# Patient Record
Sex: Female | Born: 1980 | Race: Black or African American | Hispanic: No | Marital: Single | Smoking: Current every day smoker
Health system: Southern US, Community
[De-identification: ages and names within clinical notes are randomized; demographics above are authoritative.]

## PROBLEM LIST (undated history)

## (undated) HISTORY — PX: NO PAST SURGERIES: SHX2092

---

## 2013-05-14 ENCOUNTER — Encounter (HOSPITAL_BASED_OUTPATIENT_CLINIC_OR_DEPARTMENT_OTHER): Payer: Self-pay | Admitting: *Deleted

## 2013-05-14 ENCOUNTER — Emergency Department (HOSPITAL_BASED_OUTPATIENT_CLINIC_OR_DEPARTMENT_OTHER)
Admission: EM | Admit: 2013-05-14 | Discharge: 2013-05-14 | Disposition: A | Discharge status: Home / Self Care | Payer: No Typology Code available for payment source | Attending: Emergency Medicine | Admitting: Emergency Medicine

## 2013-05-14 DIAGNOSIS — R220 Localized swelling, mass and lump, head: Secondary | ICD-10-CM | POA: Diagnosis present

## 2013-05-14 DIAGNOSIS — K089 Disorder of teeth and supporting structures, unspecified: Secondary | ICD-10-CM | POA: Insufficient documentation

## 2013-05-14 DIAGNOSIS — F17200 Nicotine dependence, unspecified, uncomplicated: Secondary | ICD-10-CM | POA: Insufficient documentation

## 2013-05-14 DIAGNOSIS — K0889 Other specified disorders of teeth and supporting structures: Secondary | ICD-10-CM | POA: Diagnosis present

## 2013-05-14 MED ORDER — OXYCODONE-ACETAMINOPHEN 5-325 MG PO TABS: 1.0000 | ORAL_TABLET | Freq: Four times a day (QID) | ORAL | Status: DC | PRN

## 2013-05-14 NOTE — ED Provider Notes (Signed)
CSN: [id number]     Arrival date & time 05/14/13  0816 History   First MD Initiated Contact with Patient 05/14/13 [phone]     Chief Complaint  Patient presents with  . facial swelling    (Consider location/radiation/quality/duration/timing/severity/associated sxs/prior Treatment) Patient is a 32 y.o. female presenting with tooth pain. The history is provided by the patient.  Dental Pain Location:  Lower Lower teeth location:  19/LL 1st molar Quality:  Dull Severity:  Moderate Onset quality:  Gradual Duration:  2 days Timing:  Constant Progression:  Worsening Chronicity:  Recurrent Context: dental caries   Previous work-up:  Dental exam and filled cavity Relieved by:  Nothing Worsened by:  Nothing tried Ineffective treatments:  NSAIDs Associated symptoms: no congestion, no drooling, no fever, no headaches, no neck pain, no neck swelling and no trismus     History reviewed. No pertinent past medical history. History reviewed. No pertinent past surgical history. History reviewed. No pertinent family history. History  Substance Use Topics  . Smoking status: Current Every Day Smoker  . Smokeless tobacco: Not on file  . Alcohol Use: No   OB History   Grav Para Term Preterm Abortions TAB SAB Ect Mult Living                 Review of Systems  Constitutional: Negative for fever and fatigue.  HENT: Negative for congestion, drooling and neck pain.   Eyes: Negative for pain.  Respiratory: Negative for cough and shortness of breath.   Cardiovascular: Negative for chest pain.  Gastrointestinal: Negative for nausea, vomiting, abdominal pain and diarrhea.  Genitourinary: Negative for dysuria and hematuria.  Musculoskeletal: Negative for back pain and gait problem.  Skin: Negative for color change.  Neurological: Negative for dizziness and headaches.  Hematological: Negative for adenopathy.  Psychiatric/Behavioral: Negative for behavioral problems.  All other systems reviewed and  are negative.    Allergies  Review of patient's allergies indicates no known allergies.  Home Medications   Current Outpatient Rx  Name  Route  Sig  Dispense  Refill  . oxyCODONE-acetaminophen (PERCOCET) 5-325 MG per tablet   Oral   Take 1 tablet by mouth every 6 (six) hours as needed for pain.   15 tablet   0    BP 135/79  Pulse 92  Temp(Src) 98.1 F (36.7 C) (Oral)  Resp 16  Ht 5\' 11"  (1.803 m)  Wt 280 lb (127.007 kg)  BMI 39.07 kg/m2  SpO2 99%  LMP 05/09/2013 Physical Exam  Nursing note and vitals reviewed. Constitutional: She is oriented to person, place, and time. She appears well-developed and well-nourished.  HENT:  Head: Normocephalic.  Mouth/Throat: Oropharynx is clear and moist. No oropharyngeal exudate.  No trismus. Normal rom of neck.   Mild to moderate soft tissue swelling noted at mid left mandible.   Ttp of left lower 1st molar. Temporary filling in place. No peri-apical abscess noted.   Eyes: Conjunctivae and EOM are normal. Pupils are equal, round, and reactive to light.  Neck: Normal range of motion. Neck supple.  Cardiovascular: Normal rate, regular rhythm, normal heart sounds and intact distal pulses.  Exam reveals no gallop and no friction rub.   No murmur heard. Pulmonary/Chest: Effort normal and breath sounds normal. No respiratory distress. She has no wheezes.  Abdominal: Soft. Bowel sounds are normal. There is no tenderness. There is no rebound and no guarding.  Musculoskeletal: Normal range of motion. She exhibits no edema and no tenderness.  Neurological: She  is alert and oriented to person, place, and time.  Skin: Skin is warm and dry.  Psychiatric: She has a normal mood and affect. Her behavior is normal.    ED Course  Procedures (including critical care time) Labs Review Labs Reviewed - No data to display Imaging Review No results found.  MDM   1. Pain, dental   2. Facial swelling    9:08 AM 32 y.o. female who presents with  dental pain that began 2 days ago. The patient saw dentist at that time who started her on amoxicillin and recommended extraction, but the patient was unable to pay for this. The patient denies any fevers and notes continued pain and left-sided jaw swelling. She has had dental pain in this area before. The patient is afebrile and vital signs are unremarkable here. She has no submandibular swelling concerning for ludwigs angina, no trismus noted, normal range of motion of neck. Recommend that patient continue taking antibiotics and will provide Percocet for pain control. We'll also provide outpatient resources for dentistry and recommend close followup.  9:10 AM:  I have discussed the diagnosis/risks/treatment options with the patient and believe the pt to be eligible for discharge home to follow-up with a dentist asap. We also discussed returning to the ED immediately if new or worsening sx occur. We discussed the sx which are most concerning (e.g., worsening swelling, worsening pain, ) that necessitate immediate return. Any new prescriptions provided to the patient are listed below.  New Prescriptions   OXYCODONE-ACETAMINOPHEN (PERCOCET) 5-325 MG PER TABLET    Take 1 tablet by mouth every 6 (six) hours as needed for pain.       Junius Argyle, MD 05/14/13 [phone]

## 2013-05-14 NOTE — ED Notes (Signed)
Pt states believes she has developed an abcessed tooth left lower . Left side of face is swelling and painful. Thinks needs antibiotic

## 2015-12-01 ENCOUNTER — Encounter (HOSPITAL_BASED_OUTPATIENT_CLINIC_OR_DEPARTMENT_OTHER): Payer: Self-pay | Admitting: *Deleted

## 2015-12-01 ENCOUNTER — Emergency Department (HOSPITAL_BASED_OUTPATIENT_CLINIC_OR_DEPARTMENT_OTHER)
Admission: EM | Admit: 2015-12-01 | Discharge: 2015-12-01 | Disposition: A | Discharge status: Home / Self Care | Payer: Medicaid Other | Attending: Emergency Medicine | Admitting: Emergency Medicine

## 2015-12-01 ENCOUNTER — Emergency Department (HOSPITAL_BASED_OUTPATIENT_CLINIC_OR_DEPARTMENT_OTHER): Payer: Medicaid Other

## 2015-12-01 DIAGNOSIS — R109 Unspecified abdominal pain: Secondary | ICD-10-CM

## 2015-12-01 DIAGNOSIS — O99332 Smoking (tobacco) complicating pregnancy, second trimester: Secondary | ICD-10-CM | POA: Insufficient documentation

## 2015-12-01 DIAGNOSIS — F17210 Nicotine dependence, cigarettes, uncomplicated: Secondary | ICD-10-CM | POA: Insufficient documentation

## 2015-12-01 DIAGNOSIS — O9989 Other specified diseases and conditions complicating pregnancy, childbirth and the puerperium: Secondary | ICD-10-CM | POA: Diagnosis present

## 2015-12-01 DIAGNOSIS — N760 Acute vaginitis: Secondary | ICD-10-CM

## 2015-12-01 DIAGNOSIS — B9689 Other specified bacterial agents as the cause of diseases classified elsewhere: Secondary | ICD-10-CM

## 2015-12-01 DIAGNOSIS — Z3A16 16 weeks gestation of pregnancy: Secondary | ICD-10-CM | POA: Diagnosis not present

## 2015-12-01 DIAGNOSIS — O26899 Other specified pregnancy related conditions, unspecified trimester: Secondary | ICD-10-CM

## 2015-12-01 DIAGNOSIS — O0992 Supervision of high risk pregnancy, unspecified, second trimester: Secondary | ICD-10-CM | POA: Diagnosis not present

## 2015-12-01 DIAGNOSIS — Z3490 Encounter for supervision of normal pregnancy, unspecified, unspecified trimester: Secondary | ICD-10-CM

## 2015-12-01 DIAGNOSIS — O30002 Twin pregnancy, unspecified number of placenta and unspecified number of amniotic sacs, second trimester: Secondary | ICD-10-CM | POA: Insufficient documentation

## 2015-12-01 DIAGNOSIS — O23592 Infection of other part of genital tract in pregnancy, second trimester: Secondary | ICD-10-CM | POA: Insufficient documentation

## 2015-12-01 DIAGNOSIS — R1030 Lower abdominal pain, unspecified: Secondary | ICD-10-CM

## 2015-12-01 LAB — URINALYSIS, ROUTINE W REFLEX MICROSCOPIC
BILIRUBIN URINE: NEGATIVE
GLUCOSE, UA: NEGATIVE mg/dL
HGB URINE DIPSTICK: NEGATIVE
KETONES UR: NEGATIVE mg/dL
Nitrite: NEGATIVE
PH: 6.5 (ref 5.0–8.0)
Protein, ur: NEGATIVE mg/dL
Specific Gravity, Urine: 1.015 (ref 1.005–1.030)

## 2015-12-01 LAB — WET PREP, GENITAL
Trich, Wet Prep: NONE SEEN
YEAST WET PREP: NONE SEEN

## 2015-12-01 LAB — CBC WITH DIFFERENTIAL/PLATELET
Basophils Absolute: 0 10*3/uL (ref 0.0–0.1)
Basophils Relative: 0 %
EOS ABS: 0.2 10*3/uL (ref 0.0–0.7)
EOS PCT: 1 %
HCT: 31.2 % — ABNORMAL LOW (ref 36.0–46.0)
Hemoglobin: 10.2 g/dL — ABNORMAL LOW (ref 12.0–15.0)
LYMPHS ABS: 2 10*3/uL (ref 0.7–4.0)
Lymphocytes Relative: 18 %
MCH: 26.9 pg (ref 26.0–34.0)
MCHC: 32.7 g/dL (ref 30.0–36.0)
MCV: 82.3 fL (ref 78.0–100.0)
MONOS PCT: 12 %
Monocytes Absolute: 1.4 10*3/uL — ABNORMAL HIGH (ref 0.1–1.0)
Neutro Abs: 7.7 10*3/uL (ref 1.7–7.7)
Neutrophils Relative %: 69 %
PLATELETS: 309 10*3/uL (ref 150–400)
RBC: 3.79 MIL/uL — ABNORMAL LOW (ref 3.87–5.11)
RDW: 15.7 % — ABNORMAL HIGH (ref 11.5–15.5)
WBC: 11.2 10*3/uL — ABNORMAL HIGH (ref 4.0–10.5)

## 2015-12-01 LAB — BASIC METABOLIC PANEL
Anion gap: 8 (ref 5–15)
BUN: 8 mg/dL (ref 6–20)
CALCIUM: 8.6 mg/dL — AB (ref 8.9–10.3)
CO2: 20 mmol/L — AB (ref 22–32)
CREATININE: 0.55 mg/dL (ref 0.44–1.00)
Chloride: 106 mmol/L (ref 101–111)
GFR calc non Af Amer: >60 mL/min (ref >60)
GLUCOSE: 79 mg/dL (ref 65–99)
Potassium: 3.3 mmol/L — ABNORMAL LOW (ref 3.5–5.1)
SODIUM: 134 mmol/L — AB (ref 135–145)

## 2015-12-01 LAB — URINE MICROSCOPIC-ADD ON

## 2015-12-01 LAB — PREGNANCY, URINE: Preg Test, Ur: POSITIVE — AB

## 2015-12-01 LAB — HCG, QUANTITATIVE, PREGNANCY: hCG, Beta Chain, Quant, S: 73481 m[IU]/mL — ABNORMAL HIGH (ref <5)

## 2015-12-01 MED ORDER — METRONIDAZOLE 0.75 % VA GEL: 1.0000 | Freq: Every day | VAGINAL | Status: DC

## 2015-12-01 MED ORDER — ACETAMINOPHEN 325 MG PO TABS
650.0000 mg | ORAL_TABLET | Freq: Once | ORAL | Status: AC
  Administered 2015-12-01: 650 mg via ORAL
  Filled 2015-12-01: qty 2

## 2015-12-01 NOTE — Discharge Instructions (Signed)
Keep your scheduled OB/GYN appointment. You were found to have twins on ultrasound and will need close following by your obstetrician. Return to the emergency department with any new, worsening or concerning symptoms.   Bacterial Vaginosis Bacterial vaginosis is a vaginal infection that occurs when the normal balance of bacteria in the vagina is disrupted. It results from an overgrowth of certain bacteria. This is the most common vaginal infection in women of childbearing age. Treatment is important to prevent complications, especially in pregnant women, as it can cause a premature delivery. CAUSES  Bacterial vaginosis is caused by an increase in harmful bacteria that are normally present in smaller amounts in the vagina. Several different kinds of bacteria can cause bacterial vaginosis. However, the reason that the condition develops is not fully understood. RISK FACTORS Certain activities or behaviors can put you at an increased risk of developing bacterial vaginosis, including:  Having a new sex partner or multiple sex partners.  Douching.  Using an intrauterine device (IUD) for contraception. Women do not get bacterial vaginosis from toilet seats, bedding, swimming pools, or contact with objects around them. SIGNS AND SYMPTOMS  Some women with bacterial vaginosis have no signs or symptoms. Common symptoms include:  Grey vaginal discharge.  A fishlike odor with discharge, especially after sexual intercourse.  Itching or burning of the vagina and vulva.  Burning or pain with urination. DIAGNOSIS  Your health care provider will take a medical history and examine the vagina for signs of bacterial vaginosis. A sample of vaginal fluid may be taken. Your health care provider will look at this sample under a microscope to check for bacteria and abnormal cells. A vaginal pH test may also be done.  TREATMENT  Bacterial vaginosis may be treated with antibiotic medicines. These may be given in  the form of a pill or a vaginal cream. A second round of antibiotics may be prescribed if the condition comes back after treatment. Because bacterial vaginosis increases your risk for sexually transmitted diseases, getting treated can help reduce your risk for chlamydia, gonorrhea, HIV, and herpes. HOME CARE INSTRUCTIONS   Only take over-the-counter or prescription medicines as directed by your health care provider.  If antibiotic medicine was prescribed, take it as directed. Make sure you finish it even if you start to feel better.  Tell all sexual partners that you have a vaginal infection. They should see their health care provider and be treated if they have problems, such as a mild rash or itching.  During treatment, it is important that you follow these instructions:  Avoid sexual activity or use condoms correctly.  Do not douche.  Avoid alcohol as directed by your health care provider.  Avoid breastfeeding as directed by your health care provider. SEEK MEDICAL CARE IF:   Your symptoms are not improving after 3 days of treatment.  You have increased discharge or pain.  You have a fever. MAKE SURE YOU:   Understand these instructions.  Will watch your condition.  Will get help right away if you are not doing well or get worse. FOR MORE INFORMATION  Centers for Disease Control and Prevention, Division of STD Prevention: SolutionApps.co.zawww.cdc.gov/std American Sexual Health Association (ASHA): www.ashastd.org    This information is not intended to replace advice given to you by your health care provider. Make sure you discuss any questions you have with your health care provider.   Document Released: 08/19/2005 Document Revised: 09/09/2014 Document Reviewed: 03/31/2013 Elsevier Interactive Patient Education 2016 Else[employer] Abdominal Pain  During Pregnancy Abdominal pain is common in pregnancy. Most of the time, it does not cause harm. There are many causes of abdominal pain. Some  causes are more serious than others. Some of the causes of abdominal pain in pregnancy are easily diagnosed. Occasionally, the diagnosis takes time to understand. Other times, the cause is not determined. Abdominal pain can be a sign that something is very wrong with the pregnancy, or the pain may have nothing to do with the pregnancy at all. For this reason, always tell your health care provider if you have any abdominal discomfort. HOME CARE INSTRUCTIONS  Monitor your abdominal pain for any changes. The following actions may help to alleviate any discomfort you are experiencing:  Do not have sexual intercourse or put anything in your vagina until your symptoms go away completely.  Get plenty of rest until your pain improves.  Drink clear fluids if you feel nauseous. Avoid solid food as long as you are uncomfortable or nauseous.  Only take over-the-counter or prescription medicine as directed by your health care provider.  Keep all follow-up appointments with your health care provider. SEEK IMMEDIATE MEDICAL CARE IF:  You are bleeding, leaking fluid, or passing tissue from the vagina.  You have increasing pain or cramping.  You have persistent vomiting.  You have painful or bloody urination.  You have a fever.  You notice a decrease in your baby's movements.  You have extreme weakness or feel faint.  You have shortness of breath, with or without abdominal pain.  You develop a severe headache with abdominal pain.  You have abnormal vaginal discharge with abdominal pain.  You have persistent diarrhea.  You have abdominal pain that continues even after rest, or gets worse. MAKE SURE YOU:   Understand these instructions.  Will watch your condition.  Will get help right away if you are not doing well or get worse.   This information is not intended to replace advice given to you by your health care provider. Make sure you discuss any questions you have with your health care  provider.   Document Released: 08/19/2005 Document Revised: 06/09/2013 Document Reviewed: 03/18/2013 Elsevier Interactive Patient Education [employer]

## 2015-12-01 NOTE — ED Notes (Signed)
Pt reports that she took multiple home pregnancy tests that were positive.  States that she doesn't have a doctor and would like to be checked out.  States that she has had mild 'period cramping' x 4 days.  Denies vaginal discharge.  Reports that she is approximately 4 months along.

## 2015-12-01 NOTE — ED Provider Notes (Signed)
CSN: [id number]     Arrival date & time 12/01/15  1429 History   First MD Initiated Contact with Patient 12/01/15 1553     Chief Complaint  Patient presents with  . Possible Pregnancy   HPI  Charlotte Pierce is a 35 year old female presenting with possible pregnancy and abdominal pain. She reports multiple positive home pregnancy test. Her last menstrual period was in December so she would be approximately 4 ounce pregnant. She has not gotten prenatal care yet due to insurance issues. She is scheduled for an OB/GYN appointment in the next 2 weeks. She is complaining of low abdominal cramping over the past 4 days. She states that it is bilateral and mild in nature. The pain is intermittent without exacerbating factors. She denies associated vaginal discharge, dysuria, hematuria or vaginal bleeding. She does note nausea and vomiting that has been present for approximately 3 months. She is not currently nauseous. She has that she has been taking ibuprofen over the past 2 days. She states that she stopped taking this and she read the back of the bottle and realized that it was not safe for pregnant women. Denies fevers, chills, dizziness, syncope, cough, URI symptoms, diarrhea or extremity swelling.    History reviewed. No pertinent past medical history. History reviewed. No pertinent past surgical history. History reviewed. No pertinent family history. Social History  Substance Use Topics  . Smoking status: Current Every Day Smoker -- 0.50 packs/day    Types: Cigarettes  . Smokeless tobacco: None  . Alcohol Use: No   OB History    Gravida Para Term Preterm AB TAB SAB Ectopic Multiple Living   1              Review of Systems  All other systems reviewed and are negative.     Allergies  Review of patient's allergies indicates no known allergies.  Home Medications   Prior to Admission medications   Medication Sig Start Date End Date Taking? Authorizing Provider  metroNIDAZOLE (METROGEL  VAGINAL) 0.75 % vaginal gel Place 1 Applicatorful vaginally at bedtime. Use one applicator vaginally at night time for 5 days 12/01/15   Ignacio Lowder, PA-C   BP 124/72 mmHg  Pulse 77  Temp(Src) 98.4 F (36.9 C) (Oral)  Resp 18  SpO2 99%  LMP 08/07/2015 (Approximate) Physical Exam  Constitutional: She appears well-developed and well-nourished. No distress.  Nontoxic-appearing  HENT:  Head: Normocephalic and atraumatic.  Eyes: Conjunctivae are normal. Right eye exhibits no discharge. Left eye exhibits no discharge. No scleral icterus.  Neck: Normal range of motion.  Cardiovascular: Normal rate, regular rhythm and normal heart sounds.   Pulmonary/Chest: Effort normal and breath sounds normal. No respiratory distress.  Abdominal: Soft. Bowel sounds are normal. She exhibits no distension. There is no tenderness. There is no rebound and no guarding.  Obese  Genitourinary: Cervix exhibits no motion tenderness, no discharge and no friability. No erythema or bleeding in the vagina. Vaginal discharge found.  Small amount of white vaginal discharge noted. No CMT or adnexal tenderness. Os is closed.  Musculoskeletal: Normal range of motion.  Lymphadenopathy:       Right: No inguinal adenopathy present.       Left: No inguinal adenopathy present.  Neurological: She is alert. Coordination normal.  Skin: Skin is warm and dry.  Psychiatric: She has a normal mood and affect. Her behavior is normal.  Nursing note and vitals reviewed.   ED Course  Procedures (including critical care time)  Labs Review Labs Reviewed  WET PREP, GENITAL - Abnormal; Notable for the following:    Clue Cells Wet Prep HPF POC PRESENT (*)    WBC, Wet Prep HPF POC MANY (*)    All other components within normal limits  URINALYSIS, ROUTINE W REFLEX MICROSCOPIC (NOT AT [facility]) - Abnormal; Notable for the following:    APPearance CLOUDY (*)    Leukocytes, UA TRACE (*)    All other components within normal limits  PREGNANCY,  URINE - Abnormal; Notable for the following:    Preg Test, Ur POSITIVE (*)    All other components within normal limits  CBC WITH DIFFERENTIAL/PLATELET - Abnormal; Notable for the following:    WBC 11.2 (*)    RBC 3.79 (*)    Hemoglobin 10.2 (*)    HCT 31.2 (*)    RDW 15.7 (*)    Monocytes Absolute 1.4 (*)    All other components within normal limits  BASIC METABOLIC PANEL - Abnormal; Notable for the following:    Sodium 134 (*)    Potassium 3.3 (*)    CO2 20 (*)    Calcium 8.6 (*)    All other components within normal limits  HCG, QUANTITATIVE, PREGNANCY - Abnormal; Notable for the following:    hCG, Beta Chain, Quant, S [id number] (*)    All other components within normal limits  URINE MICROSCOPIC-ADD ON - Abnormal; Notable for the following:    Squamous Epithelial / LPF 0-5 (*)    Bacteria, UA FEW (*)    All other components within normal limits  URINE CULTURE  GC/CHLAMYDIA PROBE AMP ([place]) NOT AT [facility]    Imaging Review US Ob Limited  12/01/2015  CLINICAL DATA:  35 year old pregnant female presenting with pelvic cramping. Uncertain LMP. EXAM: LIMITED OBSTETRIC ULTRASOUND FINDINGS: There is a living twin intrauterine gestation. The fetus is are separated by a membrane, which appears slightly thickened at the margin, suggesting a dichorionic diamniotic twin gestation. The developing placentas are both posterior. The developing placenta for the presenting twin appears low lying. Presenting Twin ("Twin A/1"): Heart Rate:  158 bpm Movement: Present. Presentation: Variable. Amniotic Fluid (Subjective):  Within normal limits. Femur Length:  2.1cm 16w  3d Non-Presenting Twin ("Twin B/2"): Heart Rate:  147 bpm Movement: Present. Presentation: Variable. Amniotic Fluid (Subjective):  Within normal limits. Femur Length:  2.2cm 16w  4d MATERNAL FINDINGS: Cervix:  Appears closed. Uterus/Adnexae:  No abnormality visualized. IMPRESSION: 1. Living twin intrauterine gestation. Favor a dichorionic  diamniotic twin gestation, although this is not completely certain, see comments. 2. Presenting twin measures 16 weeks 3 days by limited fetal biometry. Non presenting twin measures 16 weeks 4 days by limited fetal biometry. 3. Amniotic fluid volumes are subjectively normal and symmetric. Normal fetal cardiac activity in both fetuses. 4. Fetal anatomy not assessed on this emergency department scan. The patient should obtain a dedicated fetal anatomic survey in 2-4 weeks. 5. Low lying developing posterior placenta, which should be reassessed on the follow-up anatomy scan. Electronically Signed   By: Delbert Phenix M.D.   On: 12/01/2015 18:07   I have personally reviewed and evaluated these images and lab results as part of my medical decision-making.   EKG Interpretation None      MDM   Final diagnoses:  BV (bacterial vaginosis)  Lower abdominal pain  Pregnant   35 year old female with multiple positive home pregnancy tests and lower abdominal pain 4 days. No other complaints today. She has  not had prenatal care yet but does have an appointment scheduled in the next week. VSS. Patient is nontoxic, nonseptic appearing, in no apparent distress. Abdomen is soft, nontender without peritoneal signs. On pelvic, small amount of white vaginal discharge noted. Os is closed. No cervical motion tenderness.. Labs, imaging and vitals reviewed. Mildly elevated white blood cell count at 11.2. Hemoglobin 10.2 that we have no previous labs to compare this to. Discussed with patient that she will need to inform her OB/GYN and follow her hemoglobin levels. Potassium 3.3 but was not repeated in emergency department due to K-Dur being category C. Discussed with patient that she will need to inform her OB/GYN of low potassium and they will decide on supplementation. Urine with trace leukocytes and rare bacteria. Wet prep with clue cells and many white blood cells. We'll send urine for culture as this may be contamination  from her BV. Transvaginal ultrasound shows twin pregnancy with living fetuses. Discussed importance of strict follow-up with her OB/GYN given advanced maternal age and twin pregnancy. Patient states she will call her OB/GYN office tomorrow to attempt to schedule an earlier appointment date. Discharged with vaginal MetroGel for her BV. Patient given strict instructions return precautions.  Patient expresses understanding and agrees with plan.     Charlotte HeimlichStevi Alfonzia Woolum, PA-C 12/01/15 2010  Doug SouSam Jacubowitz, MD 12/01/15 [phone]

## 2015-12-03 LAB — URINE CULTURE

## 2015-12-04 LAB — GC/CHLAMYDIA PROBE AMP (~~LOC~~) NOT AT ARMC
CHLAMYDIA, DNA PROBE: NEGATIVE
Neisseria Gonorrhea: NEGATIVE

## 2016-01-01 ENCOUNTER — Other Ambulatory Visit (HOSPITAL_COMMUNITY): Payer: Self-pay | Admitting: Obstetrics and Gynecology

## 2016-01-01 DIAGNOSIS — O09522 Supervision of elderly multigravida, second trimester: Secondary | ICD-10-CM

## 2016-01-01 DIAGNOSIS — O280 Abnormal hematological finding on antenatal screening of mother: Secondary | ICD-10-CM

## 2016-01-01 DIAGNOSIS — O30002 Twin pregnancy, unspecified number of placenta and unspecified number of amniotic sacs, second trimester: Secondary | ICD-10-CM

## 2016-01-02 ENCOUNTER — Encounter (HOSPITAL_COMMUNITY): Payer: Self-pay | Admitting: Obstetrics and Gynecology

## 2016-01-16 ENCOUNTER — Encounter (HOSPITAL_COMMUNITY): Payer: Self-pay

## 2016-01-17 ENCOUNTER — Ambulatory Visit (HOSPITAL_COMMUNITY)
Admission: RE | Admit: 2016-01-17 | Discharge: 2016-01-17 | Disposition: A | Discharge status: Home / Self Care | Payer: Medicaid Other | Source: Ambulatory Visit | Attending: Obstetrics and Gynecology | Admitting: Obstetrics and Gynecology

## 2016-01-17 ENCOUNTER — Encounter (HOSPITAL_COMMUNITY): Payer: Self-pay

## 2016-01-17 ENCOUNTER — Other Ambulatory Visit (HOSPITAL_COMMUNITY): Payer: Self-pay | Admitting: Obstetrics and Gynecology

## 2016-01-17 ENCOUNTER — Ambulatory Visit (HOSPITAL_COMMUNITY): Payer: Self-pay

## 2016-01-17 DIAGNOSIS — Z315 Encounter for genetic counseling: Secondary | ICD-10-CM | POA: Diagnosis not present

## 2016-01-17 DIAGNOSIS — O30042 Twin pregnancy, dichorionic/diamniotic, second trimester: Secondary | ICD-10-CM | POA: Diagnosis not present

## 2016-01-17 DIAGNOSIS — Z3A23 23 weeks gestation of pregnancy: Secondary | ICD-10-CM

## 2016-01-17 DIAGNOSIS — Z36 Encounter for antenatal screening of mother: Secondary | ICD-10-CM | POA: Diagnosis not present

## 2016-01-17 DIAGNOSIS — Z1389 Encounter for screening for other disorder: Secondary | ICD-10-CM

## 2016-01-17 DIAGNOSIS — Z363 Encounter for antenatal screening for malformations: Secondary | ICD-10-CM

## 2016-01-17 DIAGNOSIS — O26879 Cervical shortening, unspecified trimester: Secondary | ICD-10-CM

## 2016-01-17 DIAGNOSIS — O09522 Supervision of elderly multigravida, second trimester: Secondary | ICD-10-CM

## 2016-01-17 DIAGNOSIS — O280 Abnormal hematological finding on antenatal screening of mother: Secondary | ICD-10-CM

## 2016-01-17 DIAGNOSIS — O30002 Twin pregnancy, unspecified number of placenta and unspecified number of amniotic sacs, second trimester: Secondary | ICD-10-CM

## 2016-01-17 NOTE — Progress Notes (Signed)
Appointment Date: 01/17/2016 DOB: [birth year] Referring Provider: Lin LandsmanStahle, Scott D., MD Attending: Dr. Alpha GulaPaul Whitecar  Charlotte Pierce and her partner, Charlotte Pierce, were seen for genetic counseling because of a maternal age of [age] y.o. and an increased risk for fetal Down syndrome based on a maternal serum Quad screen.  In summary:  Discussed maternal age and associated risks for fetal aneuploidy  Reviewed results of Quad screen  Increased risk for Down syndrome (1:21)   Not screening for other fetal aneuploidies due to twin gestation  Reviewed results of normal Harmony (cell free DNA screening)  Discussed limitations of screening in a twin gestation  Reviewed results of ultrasound  No fetal anomalies or markers seen  Reduction in risk for fetal aneuploidy  Offered additional testing   Declined amniocentesis  Reviewed family history concerns - none reported  Discussed carrier screening options  Father of the baby is a carrier for sickle cell anemia, patient does not know if she has been screened  Patient would like to have hemoglobin electrophoresis for detection of Hg S as well as other hemoglobin variants, if this has not previously been performed  They were counseled regarding maternal age and the association with risk for chromosome conditions due to nondisjunction.   We reviewed chromosomes, nondisjunction, and the associated 1 in 100 risk for fetal aneuploidy related to a maternal age of [age] y.o. at 1978w2d gestation.  They were counseled that the risk for aneuploidy decreases as gestational age increases, accounting for those pregnancies which spontaneously abort.  We briefly discussed Down syndrome (trisomy 5421), trisomies 9013 and 5618, and sex chromosome aneuploidies (47,XXX and 47,XXY) including the common features and prognoses of each.   We discussed that the Quad screen is able to adjust a person's baseline (age related) chance for specific chromosome conditions.   Based on this test, the chance for Down syndrome was increased from her age related chance to 1 in 7421.  Because this is a twin gestation, the Quad screen is not able to screen for Trisomy 18.  She understands that Quad screen can not adjust her age related chance for sex chromosome aneuploidies or Trisomy 5113.  We also reviewed the results of Charlotte Pierce's non-invasive prenatal screening (NIPS) and ultrasound.  We discussed that NIPS analyzes placental cell free DNA in maternal circulation to evaluate for the presence of extra chromosome conditions.  Thus, it is able to provide risk assessment for specific chromosome conditions, but is not diagnostic.  Specifically, Charlotte Pierce had the Harm[facility] prenatal screen.  Based on this screen, the chance for either baby to have aneuploidy for chromosomes 13, 18 or 21 was reduced to less than 1 in [id number].  This screen did not evaluate for the presence of extra or missing DNA from the X chromosome, thus the chance for fetal X chromosome aneuploidy was not evalutated.  We discussed that while cell free DNA screening is considered to be highly sensitive and specific, the efficacy in a twin gestation is less clear.  They were counseled that 50-80% of fetuses with Down syndrome and up to 90% of fetuses with trisomies 13 and 18, when well visualized, have detectable anomalies or soft markers by ultrasound.  There were no fetal anomalies or soft markers of aneuploidy identified by ultrasound today.   They were also counseled regarding diagnostic testing via amniocentesis. We reviewed the approximate 1 in 300-500 risk for complications from amniocentesis, including spontaneous pregnancy loss.   They understand that screening tests, including  ultrasound and Harmony screen, cannot rule out all birth defects or genetic syndromes. The patient was advised of this limitation and states she still does not want additional testing at this time.   Charlotte Pierce was provided with written information  regarding sickle cell anemia (SCA) including the carrier frequency and incidence in the African-American population, the availability of carrier screening and prenatal diagnosis if indicated.  In addition, we discussed that hemoglobinopathies are routinely screened for as part of the [facility] newborn screening panel.  The father of the babies, Charlotte Pierce, reported that he is a carrier for sickle cell anemia.  We discussed that if both parents are carriers there is a 1 in 4 chance to have an affected child.  Charlotte Pierce was uncertain if she has had carrier screening for hemoglobinopathies.  We discussed the option of screening by way of hemoglobin electrophoresis to identify hemoglobin variants (not just Hg S) which when inherited together with HgS can lead to a clinically significant hemoglobinopathy.  She would like to discuss this further with her primary OB at her next prenatal visit.   Both family histories were reviewed and found to be noncontributory for birth defects, intellectual disability, and known genetic conditions. Without further information regarding the provided family history, an accurate genetic risk cannot be calculated. Further genetic counseling is warranted if more information is obtained.  Charlotte Pierce denied exposure to environmental toxins or chemical agents. She denied the use of alcohol or street drugs.  She reports smoking a few cigarettes per day. She was encouraged in smoking cessation. She denied significant viral illnesses during the course of her pregnancy. Her medical and surgical histories were noncontributory.   I counseled this couple regarding the above risks and available options.  The approximate face-to-face time with the genetic counselor was 25 minutes.  Mady Gemma, MS,  Certified Genetic Counselor

## 2016-01-19 ENCOUNTER — Ambulatory Visit (HOSPITAL_COMMUNITY): Payer: Self-pay

## 2016-01-19 ENCOUNTER — Encounter (HOSPITAL_COMMUNITY): Payer: No Typology Code available for payment source

## 2016-01-23 ENCOUNTER — Other Ambulatory Visit (HOSPITAL_COMMUNITY): Payer: Self-pay

## 2016-01-31 ENCOUNTER — Ambulatory Visit (HOSPITAL_COMMUNITY): Admission: RE | Admit: 2016-01-31 | Payer: No Typology Code available for payment source | Source: Ambulatory Visit

## 2016-02-01 ENCOUNTER — Other Ambulatory Visit (HOSPITAL_COMMUNITY): Payer: Self-pay | Admitting: Maternal and Fetal Medicine

## 2016-02-01 ENCOUNTER — Ambulatory Visit (HOSPITAL_COMMUNITY)
Admission: RE | Admit: 2016-02-01 | Discharge: 2016-02-01 | Disposition: A | Discharge status: Home / Self Care | Payer: Medicaid Other | Source: Ambulatory Visit | Attending: Specialist | Admitting: Specialist

## 2016-02-01 ENCOUNTER — Encounter (HOSPITAL_COMMUNITY): Payer: Self-pay

## 2016-02-01 DIAGNOSIS — O99332 Smoking (tobacco) complicating pregnancy, second trimester: Secondary | ICD-10-CM

## 2016-02-01 DIAGNOSIS — O30042 Twin pregnancy, dichorionic/diamniotic, second trimester: Secondary | ICD-10-CM

## 2016-02-01 DIAGNOSIS — Z3A25 25 weeks gestation of pregnancy: Secondary | ICD-10-CM

## 2016-02-01 DIAGNOSIS — O26872 Cervical shortening, second trimester: Secondary | ICD-10-CM | POA: Diagnosis present

## 2016-02-01 DIAGNOSIS — O09522 Supervision of elderly multigravida, second trimester: Secondary | ICD-10-CM

## 2016-02-01 DIAGNOSIS — O26879 Cervical shortening, unspecified trimester: Secondary | ICD-10-CM

## 2016-02-01 DIAGNOSIS — F17210 Nicotine dependence, cigarettes, uncomplicated: Secondary | ICD-10-CM | POA: Insufficient documentation

## 2016-02-01 MED ORDER — BETAMETHASONE SOD PHOS & ACET 6 (3-3) MG/ML IJ SUSP
12.0000 mg | Freq: Once | INTRAMUSCULAR | Status: AC
  Administered 2016-02-01: 12 mg via INTRAMUSCULAR
  Filled 2016-02-01: qty 2

## 2016-02-02 ENCOUNTER — Ambulatory Visit (HOSPITAL_COMMUNITY)
Admission: RE | Admit: 2016-02-02 | Discharge: 2016-02-02 | Disposition: A | Discharge status: Home / Self Care | Payer: Medicaid Other | Source: Ambulatory Visit | Attending: Specialist | Admitting: Specialist

## 2016-02-02 DIAGNOSIS — O99332 Smoking (tobacco) complicating pregnancy, second trimester: Secondary | ICD-10-CM | POA: Insufficient documentation

## 2016-02-02 DIAGNOSIS — O30042 Twin pregnancy, dichorionic/diamniotic, second trimester: Secondary | ICD-10-CM | POA: Diagnosis not present

## 2016-02-02 DIAGNOSIS — Z3A25 25 weeks gestation of pregnancy: Secondary | ICD-10-CM | POA: Insufficient documentation

## 2016-02-02 DIAGNOSIS — O26879 Cervical shortening, unspecified trimester: Secondary | ICD-10-CM | POA: Diagnosis not present

## 2016-02-02 MED ORDER — BETAMETHASONE SOD PHOS & ACET 6 (3-3) MG/ML IJ SUSP
12.0000 mg | Freq: Once | INTRAMUSCULAR | Status: AC
  Administered 2016-02-02: 12 mg via INTRAMUSCULAR
  Filled 2016-02-02: qty 2

## 2016-02-05 ENCOUNTER — Other Ambulatory Visit (HOSPITAL_COMMUNITY): Payer: Self-pay | Admitting: Maternal and Fetal Medicine

## 2016-02-05 ENCOUNTER — Other Ambulatory Visit (HOSPITAL_COMMUNITY): Payer: No Typology Code available for payment source

## 2016-02-05 ENCOUNTER — Ambulatory Visit (HOSPITAL_COMMUNITY)
Admission: RE | Admit: 2016-02-05 | Discharge: 2016-02-05 | Disposition: A | Discharge status: Home / Self Care | Payer: Medicaid Other | Source: Ambulatory Visit | Attending: Specialist | Admitting: Specialist

## 2016-02-05 ENCOUNTER — Encounter (HOSPITAL_COMMUNITY): Payer: Self-pay

## 2016-02-05 DIAGNOSIS — O30042 Twin pregnancy, dichorionic/diamniotic, second trimester: Secondary | ICD-10-CM | POA: Diagnosis present

## 2016-02-05 DIAGNOSIS — O26872 Cervical shortening, second trimester: Secondary | ICD-10-CM | POA: Diagnosis not present

## 2016-02-05 DIAGNOSIS — O09522 Supervision of elderly multigravida, second trimester: Secondary | ICD-10-CM

## 2016-02-05 DIAGNOSIS — O283 Abnormal ultrasonic finding on antenatal screening of mother: Secondary | ICD-10-CM | POA: Diagnosis not present

## 2016-02-05 DIAGNOSIS — O280 Abnormal hematological finding on antenatal screening of mother: Secondary | ICD-10-CM

## 2016-02-05 DIAGNOSIS — O26879 Cervical shortening, unspecified trimester: Secondary | ICD-10-CM

## 2016-02-05 DIAGNOSIS — O99332 Smoking (tobacco) complicating pregnancy, second trimester: Secondary | ICD-10-CM

## 2016-02-05 DIAGNOSIS — Z3A26 26 weeks gestation of pregnancy: Secondary | ICD-10-CM

## 2016-02-14 ENCOUNTER — Encounter (HOSPITAL_COMMUNITY): Payer: Self-pay

## 2016-02-14 ENCOUNTER — Other Ambulatory Visit (HOSPITAL_COMMUNITY): Payer: Self-pay | Admitting: Maternal and Fetal Medicine

## 2016-02-14 ENCOUNTER — Ambulatory Visit (HOSPITAL_COMMUNITY)
Admission: RE | Admit: 2016-02-14 | Discharge: 2016-02-14 | Disposition: A | Discharge status: Home / Self Care | Payer: Medicaid Other | Source: Ambulatory Visit | Attending: Obstetrics and Gynecology | Admitting: Obstetrics and Gynecology

## 2016-02-14 DIAGNOSIS — O99332 Smoking (tobacco) complicating pregnancy, second trimester: Secondary | ICD-10-CM | POA: Insufficient documentation

## 2016-02-14 DIAGNOSIS — O26872 Cervical shortening, second trimester: Secondary | ICD-10-CM | POA: Insufficient documentation

## 2016-02-14 DIAGNOSIS — O283 Abnormal ultrasonic finding on antenatal screening of mother: Secondary | ICD-10-CM | POA: Diagnosis not present

## 2016-02-14 DIAGNOSIS — O09522 Supervision of elderly multigravida, second trimester: Secondary | ICD-10-CM | POA: Insufficient documentation

## 2016-02-14 DIAGNOSIS — Z3A27 27 weeks gestation of pregnancy: Secondary | ICD-10-CM | POA: Diagnosis not present

## 2016-02-14 DIAGNOSIS — O26879 Cervical shortening, unspecified trimester: Secondary | ICD-10-CM

## 2016-02-14 DIAGNOSIS — O30042 Twin pregnancy, dichorionic/diamniotic, second trimester: Secondary | ICD-10-CM | POA: Diagnosis not present

## 2016-02-15 ENCOUNTER — Other Ambulatory Visit (HOSPITAL_COMMUNITY): Payer: Self-pay | Admitting: *Deleted

## 2016-02-15 DIAGNOSIS — O30049 Twin pregnancy, dichorionic/diamniotic, unspecified trimester: Secondary | ICD-10-CM

## 2016-03-13 ENCOUNTER — Encounter (HOSPITAL_COMMUNITY): Payer: Self-pay

## 2016-03-13 ENCOUNTER — Ambulatory Visit (HOSPITAL_COMMUNITY)
Admission: RE | Admit: 2016-03-13 | Discharge: 2016-03-13 | Disposition: A | Discharge status: Home / Self Care | Payer: Medicaid Other | Source: Ambulatory Visit | Attending: Obstetrics and Gynecology | Admitting: Obstetrics and Gynecology

## 2016-03-13 ENCOUNTER — Other Ambulatory Visit (HOSPITAL_COMMUNITY): Payer: Self-pay | Admitting: Maternal and Fetal Medicine

## 2016-03-13 DIAGNOSIS — Z36 Encounter for antenatal screening of mother: Secondary | ICD-10-CM | POA: Diagnosis not present

## 2016-03-13 DIAGNOSIS — O30043 Twin pregnancy, dichorionic/diamniotic, third trimester: Secondary | ICD-10-CM | POA: Diagnosis present

## 2016-03-13 DIAGNOSIS — O09523 Supervision of elderly multigravida, third trimester: Secondary | ICD-10-CM

## 2016-03-13 DIAGNOSIS — Z3A31 31 weeks gestation of pregnancy: Secondary | ICD-10-CM

## 2016-03-13 DIAGNOSIS — O289 Unspecified abnormal findings on antenatal screening of mother: Secondary | ICD-10-CM

## 2016-03-13 DIAGNOSIS — E669 Obesity, unspecified: Secondary | ICD-10-CM | POA: Insufficient documentation

## 2016-03-13 DIAGNOSIS — O26873 Cervical shortening, third trimester: Secondary | ICD-10-CM

## 2016-03-13 DIAGNOSIS — O30049 Twin pregnancy, dichorionic/diamniotic, unspecified trimester: Secondary | ICD-10-CM

## 2016-03-13 DIAGNOSIS — O99213 Obesity complicating pregnancy, third trimester: Secondary | ICD-10-CM

## 2016-03-13 DIAGNOSIS — O99333 Smoking (tobacco) complicating pregnancy, third trimester: Secondary | ICD-10-CM | POA: Diagnosis not present

## 2016-04-10 ENCOUNTER — Ambulatory Visit (HOSPITAL_COMMUNITY)
Admission: RE | Admit: 2016-04-10 | Discharge: 2016-04-10 | Disposition: A | Discharge status: Home / Self Care | Payer: Medicaid Other | Source: Ambulatory Visit | Attending: Obstetrics and Gynecology | Admitting: Obstetrics and Gynecology

## 2016-04-10 ENCOUNTER — Encounter (HOSPITAL_COMMUNITY): Payer: Self-pay

## 2016-04-10 DIAGNOSIS — O30043 Twin pregnancy, dichorionic/diamniotic, third trimester: Secondary | ICD-10-CM | POA: Insufficient documentation

## 2016-04-10 DIAGNOSIS — O30049 Twin pregnancy, dichorionic/diamniotic, unspecified trimester: Secondary | ICD-10-CM

## 2016-04-10 DIAGNOSIS — Z3A35 35 weeks gestation of pregnancy: Secondary | ICD-10-CM | POA: Diagnosis not present

## 2016-11-20 ENCOUNTER — Encounter (HOSPITAL_COMMUNITY): Payer: Self-pay

## 2017-11-14 IMAGING — US US MFM OB TRANSVAGINAL
1 series · 12 of 28 positions shown · non-contrast
Comparison: none

[Series 1: us mfm ob transvaginal · 12 of 127 slices shown]
[im 5/127]
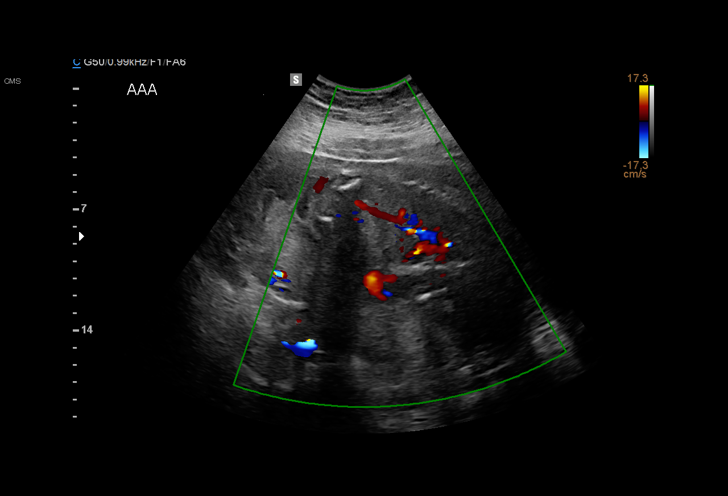
[im 15/127]
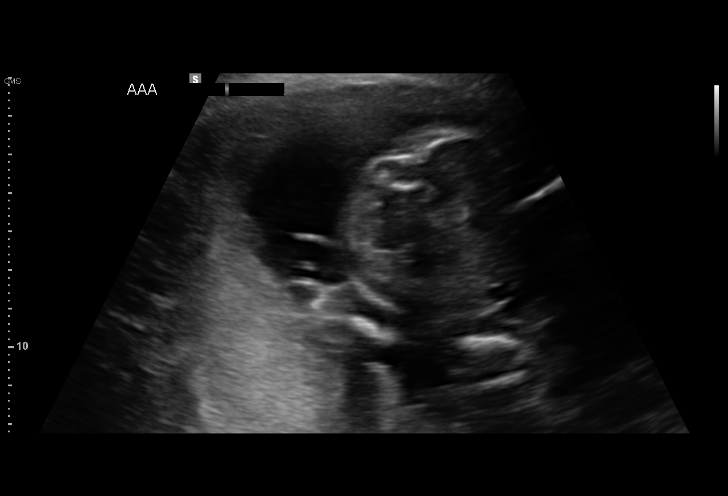
[im 24/127]
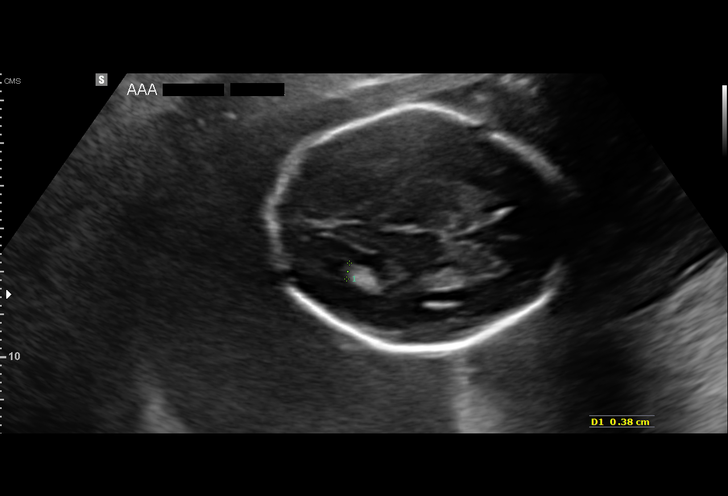
[im 38/127]
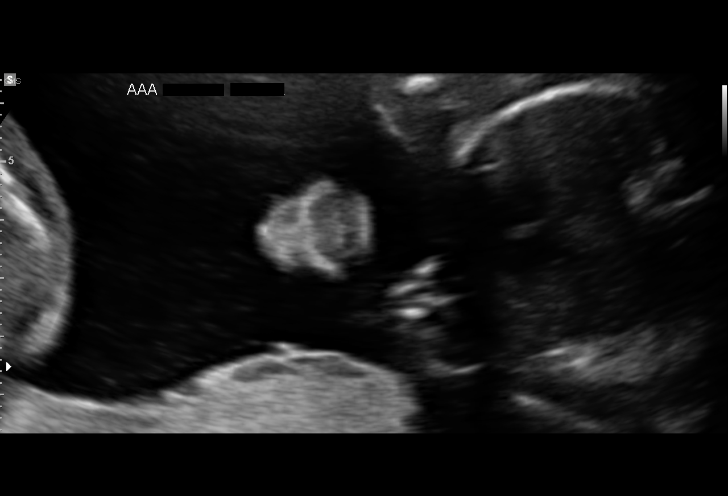
[im 47/127]
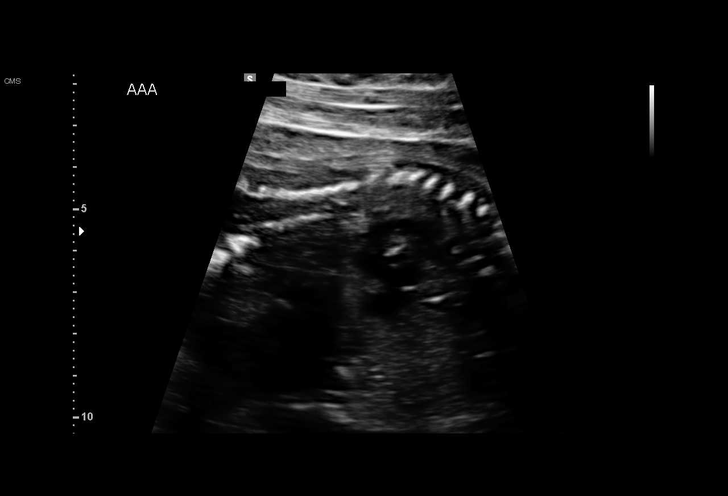
[im 57/127]
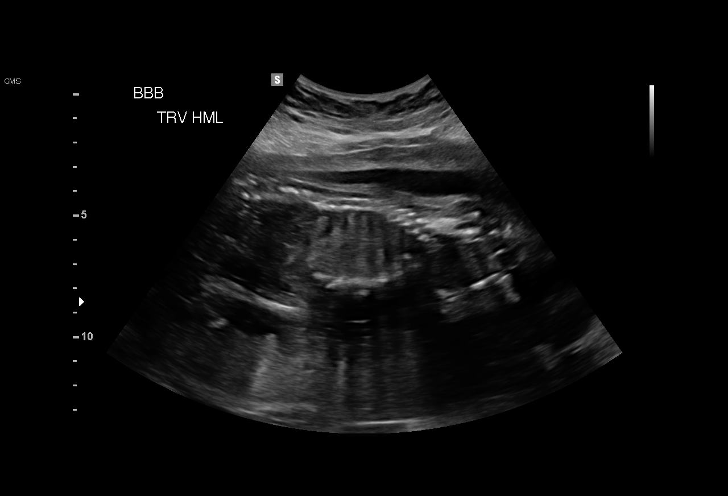
[im 71/127]
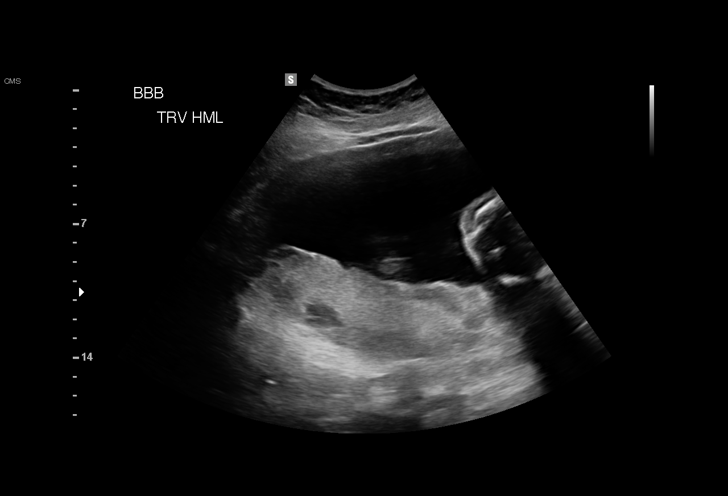
[im 80/127]
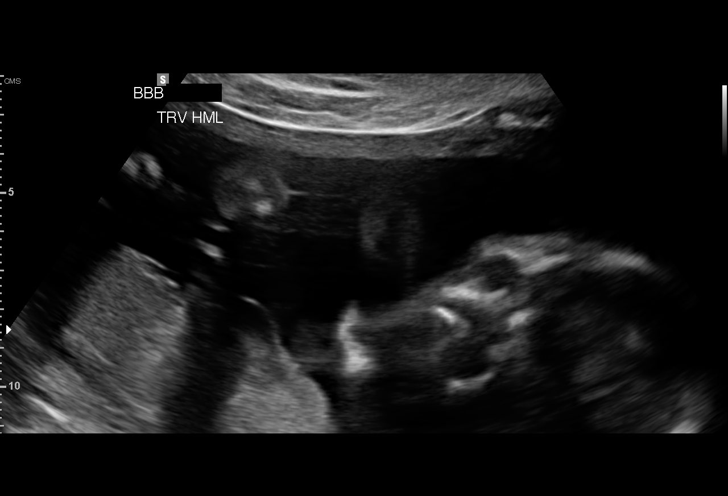
[im 89/127]
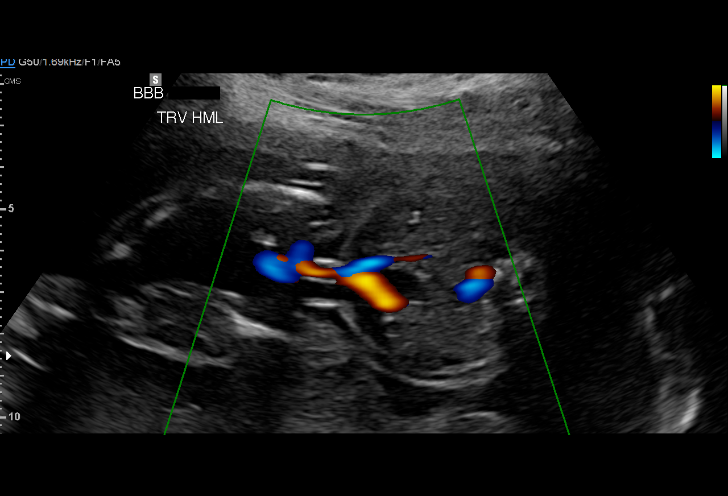
[im 103/127]
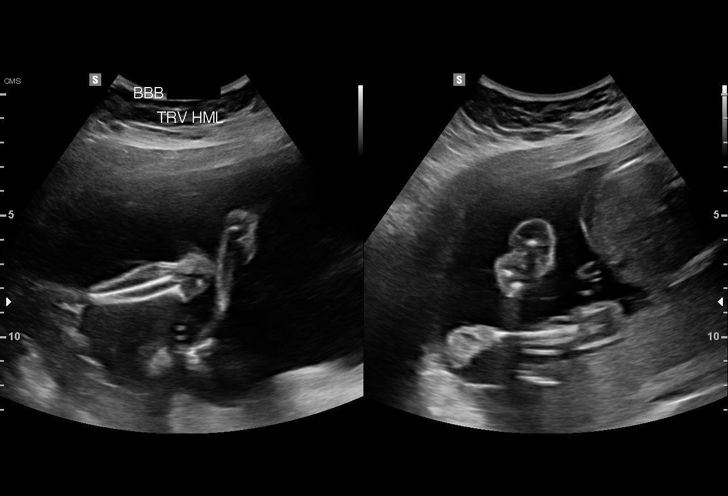
[im 113/127]
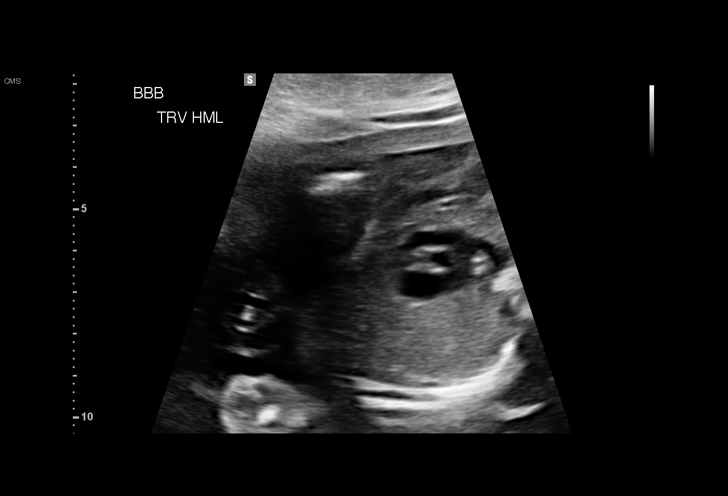
[im 122/127]
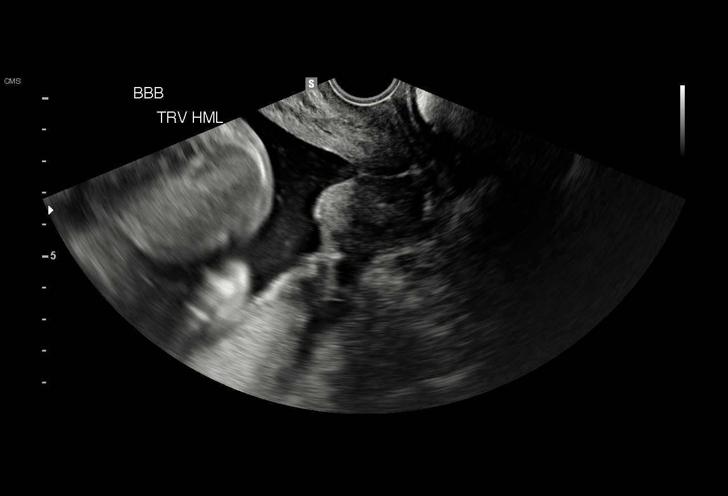

[12 of 28 positions shown; findings below may reference images not displayed]

[HOSPITAL][HOSPITAL]

[facility]

1  ROZIANI GUNNER             [id number]      [id number]     [id number]
2  ROZIANI GUNNER             [id number]      [id number]     [id number]
3  ABDE LAKRIM ASAS            [id number]      [id number]     [id number]
Indications

23 weeks gestation of pregnancy
Detailed fetal anatomic survey                 Z36
Twin pregnancy, di/di, second trimester
OB History

Gravidity:    5         Term:   3        Prem:   0        SAB:   1
TOP:          0       Ectopic:  0        Living: 3
Fetal Evaluation (Fetus A)

Num Of Fetuses:     2
Fetal Heart         167
Rate(bpm):
Cardiac Activity:   Observed
Fetal Lie:          Lower Fetus
Presentation:       Variable
Placenta:           Posterior, above cervical os
P. Cord Insertion:  Visualized, central

Amniotic Fluid
AFI FV:      Subjectively within normal limits

Largest Pocket(cm)
6.2
Biometry (Fetus A)

BPD:      56.8  mm     G. Age:  23w 2d         48  %    CI:        74.99   %   70 - 86
FL/HC:      19.0   %   19.2 -
HC:      208.1  mm     G. Age:  22w 6d         21  %    HC/AC:      1.11       1.05 -
AC:      187.4  mm     G. Age:  23w 4d         49  %    FL/BPD:     69.7   %   71 - 87
FL:       39.6  mm     G. Age:  22w 5d         23  %    FL/AC:      21.1   %   20 - 24
HUM:      39.4  mm     G. Age:  24w 0d         59  %
CER:        24  mm     G. Age:  22w 0d         29  %
CM:        7.9  mm

Est. FW:     569  gm      1 lb 4 oz     51  %     FW Discordancy         1  %
Gestational Age (Fetus A)

LMP:           23w 2d       Date:   08/07/15                 EDD:   05/13/16
U/S Today:     23w 1d                                        EDD:   05/14/16
Best:          23w 2d    Det. By:   LMP  (08/07/15)          EDD:   05/13/16
Anatomy (Fetus A)

Cranium:               Appears normal         Aortic Arch:            Not well visualized
Cavum:                 Appears normal         Ductal Arch:            Not well visualized
Ventricles:            Appears normal         Diaphragm:              Not well visualized
Choroid Plexus:        Appears normal         Stomach:                Appears normal, left
sided
Cerebellum:            Appears normal         Abdomen:                Appears normal
Posterior Fossa:       Appears normal         Abdominal Wall:         Not well visualized
Nuchal Fold:           Not applicable (>20    Cord Vessels:           Not well visualized
wks GA)
Face:                  Orbits nl; profile not Kidneys:                Appear normal
well visualized
Lips:                  Appears normal         Bladder:                Appears normal
Thoracic:              Appears normal         Spine:                  Appears normal
Heart:                 Not well visualized    Upper Extremities:      Appears normal
RVOT:                  Not well visualized    Lower Extremities:      Appears normal
LVOT:                  Not well visualized

Other:  Fetus appears to be a male. Technically difficult due to maternal
habitus and fetal position.

Fetal Evaluation (Fetus B)

Num Of Fetuses:     2
Fetal Heart         155
Rate(bpm):
Cardiac Activity:   Observed
Fetal Lie:          Upper Fetus
Presentation:       Transverse, head to maternal left
Placenta:           Posterior, above cervical os
P. Cord Insertion:  Visualized, central

Amniotic Fluid
AFI FV:      Subjectively within normal limits
Biometry (Fetus B)

BPD:      53.2  mm     G. Age:  22w 1d         10  %    CI:        69.79   %   70 - 86
FL/HC:      21.0   %   19.2 -
HC:      203.2  mm     G. Age:  22w 3d         10  %    HC/AC:      1.12       1.05 -
AC:       181   mm     G. Age:  22w 6d         31  %    FL/BPD:     80.1   %   71 - 87
FL:       42.6  mm     G. Age:  24w 0d         59  %    FL/AC:      23.5   %   20 - 24
HUM:        40  mm     G. Age:  24w 2d         66  %
CER:        25  mm     G. Age:  23w 0d         45  %

CM:        6.6  mm
Est. FW:     575  gm      1 lb 4 oz     52  %     FW Discordancy      0 \ 1 %
Gestational Age (Fetus B)

LMP:           23w 2d       Date:   08/07/15                 EDD:   05/13/16
U/S Today:     22w 6d                                        EDD:   05/16/16
Best:          23w 2d    Det. By:   LMP  (08/07/15)          EDD:   05/13/16
Anatomy (Fetus B)

Cranium:               Appears normal         Aortic Arch:            Appears normal
Cavum:                 Appears normal         Ductal Arch:            Not well visualized
Ventricles:            Appears normal         Diaphragm:              Not well visualized
Choroid Plexus:        Appears normal         Stomach:                Appears normal, left
sided
Cerebellum:            Appears normal         Abdomen:                Appears normal
Posterior Fossa:       Appears normal         Abdominal Wall:         Appears nml (cord
insert, abd wall)
Nuchal Fold:           Not applicable (>20    Cord Vessels:           Appears normal (3
wks GA)                                        vessel cord)
Face:                  Orbits nl; profile not Kidneys:                Appear normal
well visualized
Lips:                  Appears normal         Bladder:                Appears normal
Thoracic:              Appears normal         Spine:                  Appears normal
Heart:                 Not well visualized    Upper Extremities:      Appears normal
RVOT:                  Appears normal         Lower Extremities:      Appears normal
LVOT:                  Appears normal

Other:  Fetus appears to be a male. Heels visualized. Technically difficult due
to maternal habitus and fetal position.
Cervix Uterus Adnexa

Cervix
Length:              2  cm.
Measured transvaginally.

Uterus
No abnormality visualized.

Left Ovary
Not visualized.

Right Ovary
Not visualized.

Cul De Sac:   No free fluid seen.

Adnexa:       No abnormality visualized.
Impression

DC/DA twin gestation at 23w 2d
Normal NIPT (Harmony)

Twin A:
Variable presentation, male, posterior placenta
Limited views of the fetal heart obtained
The estimated fetal weight is at the 51st %tile
Normal amniotic fluid volume

Twin B:
Transverse lie, fetal head to the maternal left, male, posterior
placenta
Limited views of the heart and diaphragm obtained
The estimated fetal weight is at the 52nd %tile
Normal amniotic fluid volume

TVUS - cervical length 2 cm.  Some V-shaped funneling
noted at the internal os
Recommendations

Recommend follow up ultrasound in 2 weeks for cervical
length; if worsening cervical shortening noted, would consider
a course of Daille

Ultrasound for interval growth, reevaluate the fetal hearts in 4
weeks

## 2019-02-06 ENCOUNTER — Encounter (HOSPITAL_BASED_OUTPATIENT_CLINIC_OR_DEPARTMENT_OTHER): Payer: Self-pay | Admitting: Emergency Medicine

## 2019-02-06 ENCOUNTER — Other Ambulatory Visit: Payer: Self-pay

## 2019-02-06 ENCOUNTER — Emergency Department (HOSPITAL_BASED_OUTPATIENT_CLINIC_OR_DEPARTMENT_OTHER)
Admission: EM | Admit: 2019-02-06 | Discharge: 2019-02-06 | Disposition: A | Discharge status: Home / Self Care | Payer: Medicaid Other | Attending: Emergency Medicine | Admitting: Emergency Medicine

## 2019-02-06 DIAGNOSIS — F17210 Nicotine dependence, cigarettes, uncomplicated: Secondary | ICD-10-CM | POA: Insufficient documentation

## 2019-02-06 DIAGNOSIS — L02211 Cutaneous abscess of abdominal wall: Secondary | ICD-10-CM | POA: Diagnosis not present

## 2019-02-06 DIAGNOSIS — R1907 Generalized intra-abdominal and pelvic swelling, mass and lump: Secondary | ICD-10-CM | POA: Diagnosis present

## 2019-02-06 DIAGNOSIS — L0291 Cutaneous abscess, unspecified: Secondary | ICD-10-CM

## 2019-02-06 MED ORDER — LIDOCAINE-EPINEPHRINE 1 %-1:100000 IJ SOLN
INTRAMUSCULAR | Status: AC
  Administered 2019-02-06: 1 mL
  Filled 2019-02-06: qty 1

## 2019-02-06 MED ORDER — IBUPROFEN 200 MG PO TABS
600.0000 mg | ORAL_TABLET | Freq: Once | ORAL | Status: AC
  Administered 2019-02-06: 600 mg via ORAL
  Filled 2019-02-06: qty 1

## 2019-02-06 MED ORDER — DOXYCYCLINE HYCLATE 100 MG PO CAPS: 100.0000 mg | ORAL_CAPSULE | Freq: Two times a day (BID) | ORAL | 0 refills | Status: DC

## 2019-02-06 MED ORDER — LIDOCAINE-EPINEPHRINE 2 %-1:100000 IJ SOLN
20.0000 mL | Freq: Once | INTRAMUSCULAR | Status: DC
  Filled 2019-02-06: qty 20

## 2019-02-06 MED ORDER — IBUPROFEN 600 MG PO TABS: 600.0000 mg | ORAL_TABLET | Freq: Three times a day (TID) | ORAL | 0 refills | Status: DC | PRN

## 2019-02-06 NOTE — Discharge Instructions (Addendum)
Take the medications as prescribed, return to the ED, and urgent care or primary care doctor to have the wound rechecked and the packing removed in 48 hours.  You can continue to soak or apply warm compresses.  When you come to have the packing removed it would be a good idea to soak the wound in warm water prior to arrival.

## 2019-02-06 NOTE — ED Provider Notes (Signed)
[facility] EMERGENCY DEPARTMENT Provider Note   CSN: [id number] Arrival date & time: 02/06/19  1011    History   Chief Complaint Chief Complaint  Patient presents with  . Abscess    HPI Charlotte Pierce is a 38 y.o. female.     HPI Patient presented to the emergency room for evaluation of a skin infection.  Patient has noticed an area of swelling and tenderness on the right side of her abdominal wall.  Felt that it was a small abscess that was developing.  She has tried alcohol and heating pads but the area of tenderness continues to enlarge.  Patient denies any fevers or chills.  No vomiting or diarrhea. History reviewed. No pertinent past medical history.  Patient Active Problem List   Diagnosis Date Noted  . Facial swelling 05/14/2013  . Pain, dental 05/14/2013    Past Surgical History:  Procedure Laterality Date  . NO PAST SURGERIES       OB History    Gravida  5   Para  3   Term  3   Preterm      AB  1   Living  3     SAB      TAB  1   Ectopic      Multiple      Live Births               Home Medications    Prior to Admission medications   Medication Sig Start Date End Date Taking? Authorizing Provider  doxycycline (VIBRAMYCIN) 100 MG capsule Take 1 capsule (100 mg total) by mouth 2 (two) times daily. 02/06/19   Linwood DibblesKnapp, Rodneshia Greenhouse, MD  HYDROcodone-acetaminophen (NORCO/VICODIN) 5-325 MG tablet Take 1 tablet by mouth every 6 (six) hours as needed for moderate pain.    [provider]  ibuprofen (ADVIL) 600 MG tablet Take 1 tablet (600 mg total) by mouth every 8 (eight) hours as needed. 02/06/19   Linwood DibblesKnapp, Kambrea Carrasco, MD  IRON PO Take by mouth.    [provider]  metroNIDAZOLE (METROGEL VAGINAL) 0.75 % vaginal gel Place 1 Applicatorful vaginally at bedtime. Use one applicator vaginally at night time for 5 days Patient not taking: Reported on 01/17/2016 12/01/15   Barrett, Rolm GalaStevi, PA-C  OMEPRAZOLE PO Take by mouth.    [provider]  Prenatal Vit w/Fe-Methylfol-FA (PNV PO) Take by mouth.    [provider]    Family History No family history on file.  Social History Social History   Tobacco Use  . Smoking status: Current Every Day Smoker    Packs/day: 0.50    Types: Cigarettes  . Smokeless tobacco: Never Used  Substance Use Topics  . Alcohol use: No  . Drug use: No     Allergies   Patient has no known allergies.   Review of Systems Review of Systems  All other systems reviewed and are negative.    Physical Exam Updated Vital Signs BP (!) 142/95 (BP Location: Right Arm)   Pulse 93   Temp 98.6 F (37 C) (Oral)   Resp 18   Ht 1.803 m (5\' 11" )   Wt 127 kg   LMP 01/15/2019   SpO2 98%   BMI 39.05 kg/m   Physical Exam Vitals signs and nursing note reviewed.  Constitutional:      General: She is not in acute distress.    Appearance: She is well-developed.  HENT:     Head: Normocephalic and atraumatic.  Right Ear: External ear normal.     Left Ear: External ear normal.  Eyes:     General: No scleral icterus.       Right eye: No discharge.        Left eye: No discharge.     Conjunctiva/sclera: Conjunctivae normal.  Neck:     Musculoskeletal: Neck supple.     Trachea: No tracheal deviation.  Cardiovascular:     Rate and Rhythm: Normal rate.  Pulmonary:     Effort: Pulmonary effort is normal. No respiratory distress.     Breath sounds: No stridor.  Abdominal:     General: There is no distension.     Tenderness: There is abdominal tenderness.     Comments: Focal area of induration with central fluctuance approximately 2 cm in size right side of the abdominal wall, some surrounding erythema extending a few centimeters outward  Musculoskeletal:        General: No swelling or deformity.  Skin:    General: Skin is warm and dry.     Findings: No rash.  Neurological:     Mental Status: She is alert.     Cranial Nerves: Cranial nerve deficit: no gross deficits.       ED Treatments / Results  Labs (all labs ordered are listed, but only abnormal results are displayed) Labs Reviewed - No data to display  EKG None  Radiology No results found.  Procedures .Marland Kitchen.Incision and Drainage Date/Time: 02/06/2019 12:07 PM Performed by: Linwood DibblesKnapp, Jacari Iannello, MD Authorized by: Linwood DibblesKnapp, Sakia Schrimpf, MD   Consent:    Consent obtained:  Verbal   Consent given by:  Patient   Risks discussed:  Bleeding, incomplete drainage, pain and damage to other organs   Alternatives discussed:  No treatment Universal protocol:    Procedure explained and questions answered to patient or proxy's satisfaction: yes     Relevant documents present and verified: yes     Test results available and properly labeled: yes     Imaging studies available: yes     Required blood products, implants, devices, and special equipment available: yes     Site/side marked: yes     Immediately prior to procedure a time out was called: yes     Patient identity confirmed:  Verbally with patient Location:    Type:  Abscess Pre-procedure details:    Skin preparation:  Betadine Anesthesia (see MAR for exact dosages):    Anesthesia method:  Local infiltration   Local anesthetic:  Lidocaine 1% WITH epi Procedure type:    Complexity:  Complex Procedure details:    Incision types:  Single straight   Incision depth:  Subcutaneous   Scalpel blade:  11   Wound management:  Probed and deloculated, irrigated with saline and extensive cleaning   Drainage:  Purulent   Drainage amount:  Copious   Packing materials:  1/4 in gauze Post-procedure details:    Patient tolerance of procedure:  Tolerated well, no immediate complications   (including critical care time)  Medications Ordered in ED Medications  lidocaine-EPINEPHrine (XYLOCAINE W/EPI) 2 %-1:100000 (with pres) injection 20 mL (has no administration in time range)  ibuprofen (ADVIL) tablet 600 mg (has no administration in time range)  lidocaine-EPINEPHrine  (XYLOCAINE W/EPI) 1 %-1:100000 (with pres) injection (1 mL  Given by Other 02/06/19 1207)     Initial Impression / Assessment and Plan / ED Course  I have reviewed the triage vital signs and the nursing notes.  Pertinent labs & imaging  results that were available during my care of the patient were reviewed by me and considered in my medical decision making (see chart for details).   Patient presented to the ED for evaluation of an abdominal wall abscess.  Patient went incision and drainage.  Copious amounts of purulent drainage was released from the wound.  Wound was irrigated and packing was placed.  Because of the surrounding erythema the patient was placed on a course of antibiotics.  Final Clinical Impressions(s) / ED Diagnoses   Final diagnoses:  Abscess    ED Discharge Orders         Ordered    ibuprofen (ADVIL) 600 MG tablet  Every 8 hours PRN     02/06/19 1205    doxycycline (VIBRAMYCIN) 100 MG capsule  2 times daily     02/06/19 1205           Dorie Rank, MD 02/06/19 1208

## 2019-02-06 NOTE — ED Notes (Signed)
Copious amount of serousangenous drainage removed from right abdominal abscess by EDP with nurse in attendance.

## 2019-02-06 NOTE — ED Notes (Signed)
ED Provider at bedside. 

## 2019-02-06 NOTE — ED Triage Notes (Signed)
Abscess to R side of abdomen.

## 2020-01-19 ENCOUNTER — Ambulatory Visit: Payer: Medicaid Other

## 2021-01-29 ENCOUNTER — Other Ambulatory Visit: Payer: Self-pay

## 2021-01-29 ENCOUNTER — Encounter (HOSPITAL_BASED_OUTPATIENT_CLINIC_OR_DEPARTMENT_OTHER): Payer: Self-pay | Admitting: *Deleted

## 2021-01-29 ENCOUNTER — Emergency Department (HOSPITAL_BASED_OUTPATIENT_CLINIC_OR_DEPARTMENT_OTHER)
Admission: EM | Admit: 2021-01-29 | Discharge: 2021-01-29 | Disposition: A | Discharge status: Home / Self Care | Payer: Managed Care, Other (non HMO) | Attending: Emergency Medicine | Admitting: Emergency Medicine

## 2021-01-29 ENCOUNTER — Emergency Department (HOSPITAL_BASED_OUTPATIENT_CLINIC_OR_DEPARTMENT_OTHER): Payer: Managed Care, Other (non HMO)

## 2021-01-29 DIAGNOSIS — Y92009 Unspecified place in unspecified non-institutional (private) residence as the place of occurrence of the external cause: Secondary | ICD-10-CM | POA: Insufficient documentation

## 2021-01-29 DIAGNOSIS — M25571 Pain in right ankle and joints of right foot: Secondary | ICD-10-CM

## 2021-01-29 DIAGNOSIS — F17210 Nicotine dependence, cigarettes, uncomplicated: Secondary | ICD-10-CM | POA: Insufficient documentation

## 2021-01-29 DIAGNOSIS — M25511 Pain in right shoulder: Secondary | ICD-10-CM | POA: Diagnosis not present

## 2021-01-29 DIAGNOSIS — Y9301 Activity, walking, marching and hiking: Secondary | ICD-10-CM | POA: Diagnosis not present

## 2021-01-29 DIAGNOSIS — W01198A Fall on same level from slipping, tripping and stumbling with subsequent striking against other object, initial encounter: Secondary | ICD-10-CM | POA: Diagnosis not present

## 2021-01-29 DIAGNOSIS — W19XXXA Unspecified fall, initial encounter: Secondary | ICD-10-CM

## 2021-01-29 MED ORDER — IBUPROFEN 800 MG PO TABS: 800.0000 mg | ORAL_TABLET | Freq: Three times a day (TID) | ORAL | 0 refills | Status: DC | PRN

## 2021-01-29 NOTE — ED Triage Notes (Signed)
2 days ago she tripped over a piece of molding and fell. Injury to her right shoulder and right ankle. She is ambulatory but guarded.

## 2021-01-29 NOTE — Discharge Instructions (Addendum)
I recommend a combination of tylenol and ibuprofen for management of your pain. You can take a low dose of both at the same time. I recommend 325 mg of Tylenol combined with 800 mg of ibuprofen. This is one regular strength Tylenol and one 800 mg ibuprofen. You can take these 2-3 times for day for your pain. Please try to take these medications with a small amount of food as well to prevent upsetting your stomach.  Also, please consider topical pain relieving creams such as Voltaran Gel, BioFreeze, or Icy Hot. There is also a pain relieving cream made by [employer]. You should be able to find all of these at your local pharmacy.   Below is the contact information for a local sports medicine doctor.  His name is Dr. Jordan Likes.  He is located in the same building as this emergency department.  If you are still having pain in 1 to 2 weeks I would recommend following up with them for reevaluation.  If you develop any new or worsening symptoms you can always return to the emergency department.  It was a pleasure to meet you.

## 2021-01-29 NOTE — ED Provider Notes (Signed)
[facility] EMERGENCY DEPARTMENT Provider Note   CSN: [id number] Arrival date & time: 01/29/21  1255     History Chief Complaint  Patient presents with  . Fall    Charlotte Pierce is a 40 y.o. female.  HPI   Patient is a 40 year old female with a medical history as noted below.  She presents to the emergency department due to a fall that occurred 2 days ago.  Patient states that she was walking through her house with a bag on her right shoulder and tripped over a piece of molding on the floor and fell.  She cannot provide any details regarding the fall.  She notes pain to the right ankle as well as the right shoulder.  Pain worsens with movement as well as ambulation.  She has been taking ibuprofen with mild short-term relief.  No numbness or weakness.  No other complaints.     History reviewed. No pertinent past medical history.  Patient Active Problem List   Diagnosis Date Noted  . Facial swelling 05/14/2013  . Pain, dental 05/14/2013    Past Surgical History:  Procedure Laterality Date  . NO PAST SURGERIES       OB History    Gravida  5   Para  3   Term  3   Preterm      AB  1   Living  3     SAB      IAB  1   Ectopic      Multiple      Live Births              No family history on file.  Social History   Tobacco Use  . Smoking status: Current Every Day Smoker    Packs/day: 0.50    Types: Cigarettes  . Smokeless tobacco: Never Used  Substance Use Topics  . Alcohol use: No  . Drug use: No    Home Medications Prior to Admission medications   Medication Sig Start Date End Date Taking? Authorizing Provider  ibuprofen (ADVIL) 800 MG tablet Take 1 tablet (800 mg total) by mouth every 8 (eight) hours as needed. 01/29/21  Yes Placido Sou, PA-C  doxycycline (VIBRAMYCIN) 100 MG capsule Take 1 capsule (100 mg total) by mouth 2 (two) times daily. 02/06/19   Linwood Dibbles, MD  HYDROcodone-acetaminophen (NORCO/VICODIN) 5-325 MG tablet  Take 1 tablet by mouth every 6 (six) hours as needed for moderate pain.    [provider]  IRON PO Take by mouth.    [provider]  metroNIDAZOLE (METROGEL VAGINAL) 0.75 % vaginal gel Place 1 Applicatorful vaginally at bedtime. Use one applicator vaginally at night time for 5 days Patient not taking: No sig reported 12/01/15   Barrett, Stevi, PA-C  OMEPRAZOLE PO Take by mouth.    [provider]  Prenatal Vit w/Fe-Methylfol-FA (PNV PO) Take by mouth.    [provider]    Allergies    Patient has no known allergies.  Review of Systems   Review of Systems  Musculoskeletal: Positive for arthralgias and myalgias.  Skin: Negative for color change and wound.  Neurological: Negative for weakness and numbness.    Physical Exam Updated Vital Signs BP 118/69 (BP Location: Right Arm)   Pulse 88   Temp 98.4 F (36.9 C) (Oral)   Resp 14   Ht 5\' 11"  (1.803 m)   Wt (!) 150 kg   LMP 01/27/2021   SpO2 94%  BMI 46.12 kg/m   Physical Exam Vitals and nursing note reviewed.  Constitutional:      General: She is not in acute distress.    Appearance: She is well-developed.  HENT:     Head: Normocephalic and atraumatic.     Right Ear: External ear normal.     Left Ear: External ear normal.  Eyes:     General: No scleral icterus.       Right eye: No discharge.        Left eye: No discharge.     Conjunctiva/sclera: Conjunctivae normal.  Neck:     Trachea: No tracheal deviation.  Cardiovascular:     Rate and Rhythm: Normal rate.  Pulmonary:     Effort: Pulmonary effort is normal. No respiratory distress.     Breath sounds: No stridor.  Abdominal:     General: There is no distension.  Musculoskeletal:        General: Tenderness present. No swelling or deformity.     Cervical back: Neck supple.     Comments: Right shoulder: Mild pain noted circumferentially along the right shoulder.  Full active and passive range of motion of the right shoulder.   No joint effusion noted.  No overlying skin changes.  Distal sensation intact in the right arm.  Full range of motion of the right elbow and right wrist.  2+ radial pulses.  Grip strength intact.  Right ankle: Mild pain noted circumferentially in the right ankle.  Mild joint effusion noted.  No overlying skin changes.  Distal sensation intact.  Able to wiggle the toes of the right foot without difficulty.  2+ DP pulses.  Skin:    General: Skin is warm and dry.     Findings: No rash.  Neurological:     Mental Status: She is alert.     Cranial Nerves: Cranial nerve deficit: no gross deficits.    ED Results / Procedures / Treatments   Labs (all labs ordered are listed, but only abnormal results are displayed) Labs Reviewed - No data to display  EKG None  Radiology DG Shoulder Right  Result Date: 01/29/2021 CLINICAL DATA:  Larey Seat 2 days ago.  Right shoulder pain. EXAM: RIGHT SHOULDER - 2+ VIEW COMPARISON:  None. FINDINGS: There is no evidence of fracture or dislocation. There is no evidence of arthropathy or other focal bone abnormality. Soft tissues are unremarkable. IMPRESSION: Negative. Electronically Signed   By: Amie Portland M.D.   On: 01/29/2021 14:16   DG Ankle Complete Right  Result Date: 01/29/2021 CLINICAL DATA:  Larey Seat 2 days ago.  Right ankle pain. EXAM: RIGHT ANKLE - COMPLETE 3+ VIEW COMPARISON:  None. FINDINGS: No fracture.  No bone lesion. Ankle joint normally spaced and aligned.  No arthropathic changes. Mild soft tissue swelling. IMPRESSION: No fracture or dislocation. Electronically Signed   By: Amie Portland M.D.   On: 01/29/2021 14:17   Procedures Procedures   Medications Ordered in ED Medications - No data to display  ED Course  I have reviewed the triage vital signs and the nursing notes.  Pertinent labs & imaging results that were available during my care of the patient were reviewed by me and considered in my medical decision making (see chart for details).     MDM Rules/Calculators/A&P                          Patient is a 40 year old female who presents to the emergency  department due to a fall that occurred 2 days ago.  She was having pain in her right ankle and right shoulder.  X-rays were obtained and were negative.  Physical exam is reassuring.  She has palpable pain circumferentially in both joints but is neurovascularly intact in the right arm and leg.  She is ambulatory.    Patient given a ankle brace for comfort.  Recommended continued use of Tylenol and ibuprofen for management of her pain.  She was given a prescription for 800 mg ibuprofen.  We discussed dosing regarding Tylenol as well as ibuprofen.  Discussed the RICE method.  Patient given a referral to a local sports medicine physician if she finds that her symptoms are refractory.  Discussed return precautions.  Her questions were answered and she was amicable at the time of discharge.  Final Clinical Impression(s) / ED Diagnoses Final diagnoses:  Fall, initial encounter  Acute pain of right shoulder  Acute right ankle pain   Rx / DC Orders ED Discharge Orders         Ordered    ibuprofen (ADVIL) 800 MG tablet  Every 8 hours PRN        01/29/21 1501           Placido Sou, PA-C 01/29/21 1513    Gwyneth Sprout, MD 01/31/21 1817

## 2021-06-25 ENCOUNTER — Other Ambulatory Visit: Payer: Self-pay

## 2021-06-25 DIAGNOSIS — M65842 Other synovitis and tenosynovitis, left hand: Secondary | ICD-10-CM | POA: Insufficient documentation

## 2021-06-25 DIAGNOSIS — F17210 Nicotine dependence, cigarettes, uncomplicated: Secondary | ICD-10-CM | POA: Insufficient documentation

## 2021-06-25 DIAGNOSIS — M25532 Pain in left wrist: Secondary | ICD-10-CM | POA: Diagnosis present

## 2021-06-25 NOTE — ED Triage Notes (Signed)
Pt presents to ED Pov. Pt c/o L wrist pain x1w. Pt reports that wrist has been intermittently swelling. 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

## 2021-06-26 ENCOUNTER — Emergency Department (HOSPITAL_BASED_OUTPATIENT_CLINIC_OR_DEPARTMENT_OTHER)
Admission: EM | Admit: 2021-06-26 | Discharge: 2021-06-26 | Disposition: A | Discharge status: Home / Self Care | Payer: Managed Care, Other (non HMO) | Attending: Emergency Medicine | Admitting: Emergency Medicine

## 2021-06-26 ENCOUNTER — Encounter (HOSPITAL_BASED_OUTPATIENT_CLINIC_OR_DEPARTMENT_OTHER): Payer: Self-pay | Admitting: Emergency Medicine

## 2021-06-26 ENCOUNTER — Emergency Department (HOSPITAL_BASED_OUTPATIENT_CLINIC_OR_DEPARTMENT_OTHER): Payer: Managed Care, Other (non HMO)

## 2021-06-26 DIAGNOSIS — M65842 Other synovitis and tenosynovitis, left hand: Secondary | ICD-10-CM | POA: Diagnosis not present

## 2021-06-26 DIAGNOSIS — M659 Synovitis and tenosynovitis, unspecified: Secondary | ICD-10-CM

## 2021-06-26 MED ORDER — KETOROLAC TROMETHAMINE 30 MG/ML IJ SOLN
30.0000 mg | Freq: Once | INTRAMUSCULAR | Status: AC
  Administered 2021-06-26: 30 mg via INTRAMUSCULAR
  Filled 2021-06-26: qty 1

## 2021-06-26 MED ORDER — NAPROXEN 500 MG PO TABS: 500.0000 mg | ORAL_TABLET | Freq: Two times a day (BID) | ORAL | 0 refills | Status: DC

## 2021-06-26 NOTE — Discharge Instructions (Addendum)
Wear a wrist brace consistently. Alternate ice and heat to areas of pain 3-4 times per day.  Take naproxen as prescribed for pain. If symptoms persist, we advise follow up with an orthopedic hand specialist.

## 2021-06-26 NOTE — ED Notes (Signed)
ED Provider at bedside. 

## 2021-06-26 NOTE — ED Provider Notes (Signed)
[facility] EMERGENCY DEPARTMENT Provider Note   CSN: [id number] Arrival date & time: 06/25/21  2341     History Chief Complaint  Patient presents with   Wrist Pain    Charlotte Pierce is a 40 y.o. female.  40 year old right-hand-dominant female presents to the emergency department for 1 week of constant, atraumatic left wrist pain which has been mildly worsening since onset.  She has tried Tylenol and ibuprofen with little relief.  Did apply a topical pain reliever with some mild improvement.  Patient also has been wearing a brace, but feels this causes her wrist to become more swollen.  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  The history is provided by the patient. No language interpreter was used.  Wrist Pain      History reviewed. No pertinent past medical history.  Patient Active Problem List   Diagnosis Date Noted   Facial swelling 05/14/2013   Pain, dental 05/14/2013    Past Surgical History:  Procedure Laterality Date   NO PAST SURGERIES       OB History     Gravida  5   Para  3   Term  3   Preterm      AB  1   Living  3      SAB      IAB  1   Ectopic      Multiple      Live Births              History reviewed. No pertinent family history.  Social History   Tobacco Use   Smoking status: Every Day    Packs/day: 0.50    Types: Cigarettes   Smokeless tobacco: Never  Substance Use Topics   Alcohol use: No   Drug use: No    Home Medications Prior to Admission medications   Medication Sig Start Date End Date Taking? Authorizing Provider  naproxen (NAPROSYN) 500 MG tablet Take 1 tablet (500 mg total) by mouth 2 (two) times daily with a meal. 06/26/21  Yes Antony Madura, PA-C  doxycycline (VIBRAMYCIN) 100 MG capsule Take 1 capsule (100 mg total) by mouth 2 (two) times daily. 02/06/19   Linwood Dibbles, MD  HYDROcodone-acetaminophen (NORCO/VICODIN) 5-325 MG tablet Take 1 tablet by mouth every 6 (six)  hours as needed for moderate pain.    [provider]  ibuprofen (ADVIL) 800 MG tablet Take 1 tablet (800 mg total) by mouth every 8 (eight) hours as needed. 01/29/21   Placido Sou, PA-C  IRON PO Take by mouth.    [provider]  metroNIDAZOLE (METROGEL VAGINAL) 0.75 % vaginal gel Place 1 Applicatorful vaginally at bedtime. Use one applicator vaginally at night time for 5 days Patient not taking: No sig reported 12/01/15   Barrett, Stevi, PA-C  OMEPRAZOLE PO Take by mouth.    [provider]  Prenatal Vit w/Fe-Methylfol-FA (PNV PO) Take by mouth.    [provider]    Allergies    Patient has no known allergies.  Review of Systems   Review of Systems Ten systems reviewed and are negative for acute change, except as noted in the HPI.    Physical Exam Updated Vital Signs BP (!) 172/93 (BP Location: Right Arm)   Pulse 66   Temp 98.4 F (36.9 C) (Oral)   Resp 18   Ht 5\' 10"  (1.778 m)   Wt (!) 149.7 kg   LMP 06/12/2021 (Approximate)  SpO2 99%   BMI 47.35 kg/m   Physical Exam Vitals and nursing note reviewed.  Constitutional:      General: She is not in acute distress.    Appearance: She is well-developed. She is not diaphoretic.     Comments: Nontoxic appearing  HENT:     Head: Normocephalic and atraumatic.  Eyes:     General: No scleral icterus.    Conjunctiva/sclera: Conjunctivae normal.  Cardiovascular:     Rate and Rhythm: Normal rate and regular rhythm.     Pulses: Normal pulses.     Comments: Distal radial pulse 2+ in the left upper extremity.  Capillary refill brisk in all digits of the left hand. Pulmonary:     Effort: Pulmonary effort is normal. No respiratory distress.  Musculoskeletal:        General: Tenderness present.     Cervical back: Normal range of motion.     Comments: Tenderness of the L wrist along the radial aspect extending to the dorsum of the L thum and thenar eminence. Decreased AROM of the L wrist  appreciated secondary to pain. No bony deformity or crepitus of the L wrist or hand. Positive Finkelstein's test, left.  Skin:    General: Skin is warm and dry.     Coloration: Skin is not pale.     Findings: No erythema or rash.  Neurological:     Mental Status: She is alert and oriented to person, place, and time.     Coordination: Coordination normal.     Comments: Sensation to light touch intact in left hand and digits.  Finger to thumb opposition intact in the left hand.  Psychiatric:        Behavior: Behavior normal.    ED Results / Procedures / Treatments   Labs (all labs ordered are listed, but only abnormal results are displayed) Labs Reviewed - No data to display  EKG None  Radiology DG Wrist Complete Left  Result Date: 06/26/2021 CLINICAL DATA:  Left wrist pain. EXAM: LEFT WRIST - COMPLETE 3+ VIEW COMPARISON:  None. FINDINGS: There is no evidence of fracture or dislocation. There is no evidence of arthropathy or other focal bone abnormality. Soft tissues are unremarkable. IMPRESSION: Negative. Electronically Signed   By: Aram Candela M.D.   On: 06/26/2021 00:25    Procedures Procedures   Medications Ordered in ED Medications  ketorolac (TORADOL) 30 MG/ML injection 30 mg (30 mg Intramuscular Given 06/26/21 0319)    ED Course  I have reviewed the triage vital signs and the nursing notes.  Pertinent labs & imaging results that were available during my care of the patient were reviewed by me and considered in my medical decision making (see chart for details).    MDM Rules/Calculators/A&P                           Patient presents to the emergency department for evaluation of atraumatic L wrist pain x 1 week. Patient neurovascularly intact on exam. Imaging negative for fracture, dislocation, bony deformity. No swelling, erythema, heat to touch to the affected area; no concern for septic joint. Compartments in the affected extremity are soft.   Suspect  tenosynovitis given exam findings.  Thumb spica brace ordered for application in the ED.  Plan for supportive management including RICE and NSAIDs; hand specialist follow up as needed. Return precautions discussed and provided. Patient discharged in stable condition with no unaddressed concerns.   Final  Clinical Impression(s) / ED Diagnoses Final diagnoses:  Tenosynovitis of left wrist    Rx / DC Orders ED Discharge Orders          Ordered    naproxen (NAPROSYN) 500 MG tablet  2 times daily with meals        06/26/21 0321             Antony Madura, PA-C 06/26/21 2202    Eber Hong, MD 07/07/21 1422

## 2021-11-20 ENCOUNTER — Emergency Department (HOSPITAL_BASED_OUTPATIENT_CLINIC_OR_DEPARTMENT_OTHER): Payer: Managed Care, Other (non HMO)

## 2021-11-20 ENCOUNTER — Encounter (HOSPITAL_BASED_OUTPATIENT_CLINIC_OR_DEPARTMENT_OTHER): Payer: Self-pay

## 2021-11-20 ENCOUNTER — Inpatient Hospital Stay (HOSPITAL_BASED_OUTPATIENT_CLINIC_OR_DEPARTMENT_OTHER)
Admission: EM | Admit: 2021-11-20 | Discharge: 2021-11-23 | DRG: 392 | Disposition: A | Discharge status: Home / Self Care | Payer: Managed Care, Other (non HMO) | Attending: General Surgery | Admitting: General Surgery

## 2021-11-20 ENCOUNTER — Other Ambulatory Visit: Payer: Self-pay

## 2021-11-20 DIAGNOSIS — K5720 Diverticulitis of large intestine with perforation and abscess without bleeding: Principal | ICD-10-CM | POA: Diagnosis present

## 2021-11-20 DIAGNOSIS — E669 Obesity, unspecified: Secondary | ICD-10-CM | POA: Diagnosis present

## 2021-11-20 DIAGNOSIS — Z6841 Body Mass Index (BMI) 40.0 and over, adult: Secondary | ICD-10-CM

## 2021-11-20 DIAGNOSIS — E876 Hypokalemia: Secondary | ICD-10-CM | POA: Diagnosis not present

## 2021-11-20 DIAGNOSIS — F17210 Nicotine dependence, cigarettes, uncomplicated: Secondary | ICD-10-CM | POA: Diagnosis present

## 2021-11-20 DIAGNOSIS — Z79899 Other long term (current) drug therapy: Secondary | ICD-10-CM | POA: Diagnosis not present

## 2021-11-20 LAB — URINALYSIS, MICROSCOPIC (REFLEX)

## 2021-11-20 LAB — URINALYSIS, ROUTINE W REFLEX MICROSCOPIC
Bilirubin Urine: NEGATIVE
Glucose, UA: NEGATIVE mg/dL
Ketones, ur: NEGATIVE mg/dL
Leukocytes,Ua: NEGATIVE
Nitrite: NEGATIVE
Protein, ur: NEGATIVE mg/dL
Specific Gravity, Urine: >=1.03 (ref 1.005–1.030)
pH: 5.5 (ref 5.0–8.0)

## 2021-11-20 LAB — CBC
HCT: 38.8 % (ref 36.0–46.0)
Hemoglobin: 12.1 g/dL (ref 12.0–15.0)
MCH: 25.9 pg — ABNORMAL LOW (ref 26.0–34.0)
MCHC: 31.2 g/dL (ref 30.0–36.0)
MCV: 82.9 fL (ref 80.0–100.0)
Platelets: 331 10*3/uL (ref 150–400)
RBC: 4.68 MIL/uL (ref 3.87–5.11)
RDW: 15.8 % — ABNORMAL HIGH (ref 11.5–15.5)
WBC: 12.2 10*3/uL — ABNORMAL HIGH (ref 4.0–10.5)
nRBC: 0 % (ref 0.0–0.2)

## 2021-11-20 LAB — COMPREHENSIVE METABOLIC PANEL
ALT: 14 U/L (ref 0–44)
AST: 16 U/L (ref 15–41)
Albumin: 4.2 g/dL (ref 3.5–5.0)
Alkaline Phosphatase: 109 U/L (ref 38–126)
Anion gap: 8 (ref 5–15)
BUN: 13 mg/dL (ref 6–20)
CO2: 24 mmol/L (ref 22–32)
Calcium: 9.4 mg/dL (ref 8.9–10.3)
Chloride: 103 mmol/L (ref 98–111)
Creatinine, Ser: 0.64 mg/dL (ref 0.44–1.00)
GFR, Estimated: >60 mL/min (ref >60)
Glucose, Bld: 94 mg/dL (ref 70–99)
Potassium: 3.7 mmol/L (ref 3.5–5.1)
Sodium: 135 mmol/L (ref 135–145)
Total Bilirubin: 0.5 mg/dL (ref 0.3–1.2)
Total Protein: 8.6 g/dL — ABNORMAL HIGH (ref 6.5–8.1)

## 2021-11-20 LAB — LIPASE, BLOOD: Lipase: 29 U/L (ref 11–51)

## 2021-11-20 LAB — PREGNANCY, URINE: Preg Test, Ur: NEGATIVE

## 2021-11-20 MED ORDER — DIPHENHYDRAMINE HCL 25 MG PO CAPS: 25.0000 mg | ORAL_CAPSULE | Freq: Four times a day (QID) | ORAL | Status: DC | PRN

## 2021-11-20 MED ORDER — ONDANSETRON HCL 4 MG/2ML IJ SOLN: 4.0000 mg | Freq: Four times a day (QID) | INTRAMUSCULAR | Status: DC | PRN

## 2021-11-20 MED ORDER — SODIUM CHLORIDE 0.9 % IV SOLN: INTRAVENOUS | Status: DC

## 2021-11-20 MED ORDER — OXYCODONE HCL 5 MG PO TABS
5.0000 mg | ORAL_TABLET | ORAL | Status: DC | PRN
  Administered 2021-11-21 – 2021-11-22 (×3): 10 mg via ORAL
  Filled 2021-11-20 (×3): qty 2

## 2021-11-20 MED ORDER — SODIUM CHLORIDE 0.9 % IV BOLUS
1000.0000 mL | Freq: Once | INTRAVENOUS | Status: AC
  Administered 2021-11-20: 1000 mL via INTRAVENOUS

## 2021-11-20 MED ORDER — SODIUM CHLORIDE 0.9 % IV SOLN
2.0000 g | INTRAVENOUS | Status: DC
  Administered 2021-11-20 – 2021-11-21 (×2): 2 g via INTRAVENOUS
  Filled 2021-11-20 (×2): qty 20

## 2021-11-20 MED ORDER — ONDANSETRON HCL 4 MG/2ML IJ SOLN
4.0000 mg | Freq: Once | INTRAMUSCULAR | Status: AC
  Administered 2021-11-20: 4 mg via INTRAVENOUS
  Filled 2021-11-20: qty 2

## 2021-11-20 MED ORDER — TRAMADOL HCL 50 MG PO TABS: 50.0000 mg | ORAL_TABLET | Freq: Four times a day (QID) | ORAL | Status: DC | PRN

## 2021-11-20 MED ORDER — ONDANSETRON 4 MG PO TBDP: 4.0000 mg | ORAL_TABLET | Freq: Four times a day (QID) | ORAL | Status: DC | PRN

## 2021-11-20 MED ORDER — METRONIDAZOLE 500 MG/100ML IV SOLN
500.0000 mg | Freq: Two times a day (BID) | INTRAVENOUS | Status: DC
  Administered 2021-11-20 – 2021-11-22 (×4): 500 mg via INTRAVENOUS
  Filled 2021-11-20 (×4): qty 100

## 2021-11-20 MED ORDER — MORPHINE SULFATE (PF) 2 MG/ML IV SOLN
2.0000 mg | INTRAVENOUS | Status: DC | PRN
  Administered 2021-11-20 – 2021-11-22 (×6): 2 mg via INTRAVENOUS
  Filled 2021-11-20 (×6): qty 1

## 2021-11-20 MED ORDER — DIPHENHYDRAMINE HCL 50 MG/ML IJ SOLN: 25.0000 mg | Freq: Four times a day (QID) | INTRAMUSCULAR | Status: DC | PRN

## 2021-11-20 MED ORDER — PIPERACILLIN-TAZOBACTAM 3.375 G IVPB 30 MIN
3.3750 g | Freq: Once | INTRAVENOUS | Status: AC
  Administered 2021-11-20: 3.375 g via INTRAVENOUS
  Filled 2021-11-20: qty 50

## 2021-11-20 MED ORDER — ENOXAPARIN SODIUM 40 MG/0.4ML IJ SOSY
40.0000 mg | PREFILLED_SYRINGE | INTRAMUSCULAR | Status: DC
  Administered 2021-11-20 – 2021-11-22 (×3): 40 mg via SUBCUTANEOUS
  Filled 2021-11-20 (×3): qty 0.4

## 2021-11-20 MED ORDER — MORPHINE SULFATE (PF) 4 MG/ML IV SOLN
4.0000 mg | Freq: Once | INTRAVENOUS | Status: AC
  Administered 2021-11-20: 4 mg via INTRAVENOUS
  Filled 2021-11-20: qty 1

## 2021-11-20 MED ORDER — SIMETHICONE 80 MG PO CHEW: 40.0000 mg | CHEWABLE_TABLET | Freq: Four times a day (QID) | ORAL | Status: DC | PRN

## 2021-11-20 NOTE — ED Provider Notes (Signed)
?[facility] EMERGENCY DEPARTMENT ?Provider Note ? ? ?CSN: [id number] ?Arrival date & time: 11/20/21  1052 ? ?  ? ?History ? ?Chief Complaint  ?Patient presents with  ? Abdominal Pain  ? ? ?Charlotte Pierce is a 41 y.o. female. ? ?41 year old female presents with complaint of pain on the left side of her abdomen rating towards her left flank since eating spaghetti on Sunday.  Reports nausea, denies any changes in bowel or bladder habits, vomiting, fevers.  States that she has had chills on times.  No significant past medical history.  No prior abdominal surgeries.  Denies abnormal vaginal discharge, no history of kidney stones.  No other complaints or concerns today. ? ? ?  ? ?Home Medications ?Prior to Admission medications   ?Medication Sig Start Date End Date Taking? Authorizing Provider  ?doxycycline (VIBRAMYCIN) 100 MG capsule Take 1 capsule (100 mg total) by mouth 2 (two) times daily. 02/06/19   Linwood Dibbles, MD  ?HYDROcodone-acetaminophen (NORCO/VICODIN) 5-325 MG tablet Take 1 tablet by mouth every 6 (six) hours as needed for moderate pain.    [provider]  ?ibuprofen (ADVIL) 800 MG tablet Take 1 tablet (800 mg total) by mouth every 8 (eight) hours as needed. 01/29/21   Placido Sou, PA-C  ?IRON PO Take by mouth.    [provider]  ?metroNIDAZOLE (METROGEL VAGINAL) 0.75 % vaginal gel Place 1 Applicatorful vaginally at bedtime. Use one applicator vaginally at night time for 5 days ?Patient not taking: No sig reported 12/01/15   Barrett, Rolm Gala, PA-C  ?naproxen (NAPROSYN) 500 MG tablet Take 1 tablet (500 mg total) by mouth 2 (two) times daily with a meal. 06/26/21   Antony Madura, PA-C  ?OMEPRAZOLE PO Take by mouth.    [provider]  ?Prenatal Vit w/Fe-Methylfol-FA (PNV PO) Take by mouth.    [provider]  ?   ? ?Allergies    ?Patient has no known allergies.   ? ?Review of Systems   ?Review of Systems ?Negative except as per HPI ?Physical Exam ?Updated Vital  Signs ?BP 116/73   Pulse 84   Temp 98.4 ?F (36.9 ?C) (Oral)   Resp 18   Ht 5\' 9"  (1.753 m)   Wt (!) 144.7 kg   LMP 11/04/2021 (Approximate) Comment: neg upreg in er today.  SpO2 96%   BMI 47.11 kg/m?  ?Physical Exam ?Vitals and nursing note reviewed.  ?Constitutional:   ?   General: She is not in acute distress. ?   Appearance: She is well-developed. She is obese. She is not diaphoretic.  ?   Comments: Appears uncomfortable, lying on left side  ?HENT:  ?   Head: Normocephalic and atraumatic.  ?Cardiovascular:  ?   Rate and Rhythm: Normal rate and regular rhythm.  ?   Heart sounds: Normal heart sounds.  ?Pulmonary:  ?   Effort: Pulmonary effort is normal.  ?   Breath sounds: Normal breath sounds.  ?Abdominal:  ?   Palpations: Abdomen is soft.  ?   Tenderness: There is abdominal tenderness in the left lower quadrant. There is no right CVA tenderness or left CVA tenderness.  ?Skin: ?   General: Skin is warm and dry.  ?   Findings: No erythema or rash.  ?Neurological:  ?   Mental Status: She is alert and oriented to person, place, and time.  ?Psychiatric:     ?   Behavior: Behavior normal.  ? ? ?ED Results / Procedures / Treatments   ?  Labs ?(all labs ordered are listed, but only abnormal results are displayed) ?Labs Reviewed  ?COMPREHENSIVE METABOLIC PANEL - Abnormal; Notable for the following components:  ?    Result Value  ? Total Protein 8.6 (*)   ? All other components within normal limits  ?CBC - Abnormal; Notable for the following components:  ? WBC 12.2 (*)   ? MCH 25.9 (*)   ? RDW 15.8 (*)   ? All other components within normal limits  ?URINALYSIS, ROUTINE W REFLEX MICROSCOPIC - Abnormal; Notable for the following components:  ? Hgb urine dipstick SMALL (*)   ? All other components within normal limits  ?URINALYSIS, MICROSCOPIC (REFLEX) - Abnormal; Notable for the following components:  ? Bacteria, UA RARE (*)   ? All other components within normal limits  ?LIPASE, BLOOD  ?PREGNANCY, URINE   ? ? ?EKG ?None ? ?Radiology ?CT Renal Stone Study ? ?Result Date: 11/20/2021 ?CLINICAL DATA:  Acute left-sided abdominal pain. EXAM: CT ABDOMEN AND PELVIS WITHOUT CONTRAST TECHNIQUE: Multidetector CT imaging of the abdomen and pelvis was performed following the standard protocol without IV contrast. RADIATION DOSE REDUCTION: This exam was performed according to the departmental dose-optimization program which includes automated exposure control, adjustment of the mA and/or kV according to patient size and/or use of iterative reconstruction technique. COMPARISON:  None. FINDINGS: Lower chest: No acute abnormality. Hepatobiliary: No focal liver abnormality is seen. No gallstones, gallbladder wall thickening, or biliary dilatation. Pancreas: Unremarkable. No pancreatic ductal dilatation or surrounding inflammatory changes. Spleen: Normal in size without focal abnormality. Adrenals/Urinary Tract: Adrenal glands are unremarkable. Kidneys are normal, without renal calculi, focal lesion, or hydronephrosis. Bladder is unremarkable. Stomach/Bowel: The stomach and appendix appear normal. There is no evidence of bowel obstruction. Distal descending colonic diverticulitis is noted with small amount of adjacent free air concerning for small perforation. No definite abscess formation is seen at this time. Vascular/Lymphatic: No significant vascular findings are present. No enlarged abdominal or pelvic lymph nodes. Reproductive: Uterus and bilateral adnexa are unremarkable. Other: No abdominal wall hernia or abnormality. No abdominopelvic ascites. Musculoskeletal: No acute or significant osseous findings. IMPRESSION: Diverticulitis of distal descending colon is noted with adjacent small amount of air concerning for small perforation. No definite abscess is noted. Electronically Signed   By: Lupita RaiderJames  Green Jr M.D.   On: 11/20/2021 14:26   ? ?Procedures ?Marland Kitchen.Critical Care ?Performed by: Jeannie FendMurphy, Jo Booze A, PA-C ?Authorized by: Jeannie FendMurphy, Emrah Ariola  A, PA-C  ? ?Critical care provider statement:  ?  Critical care time (minutes):  30 ?  Critical care was time spent personally by me on the following activities:  Development of treatment plan with patient or surrogate, discussions with consultants, evaluation of patient's response to treatment, examination of patient, ordering and review of laboratory studies, ordering and review of radiographic studies, ordering and performing treatments and interventions, pulse oximetry, re-evaluation of patient's condition and review of old charts  ? ? ?Medications Ordered in ED ?Medications  ?piperacillin-tazobactam (ZOSYN) IVPB 3.375 g (3.375 g Intravenous New Bag/Given 11/20/21 1551)  ?ondansetron [facility](ZOFRAN) injection 4 mg (4 mg Intravenous Given 11/20/21 1403)  ?sodium chloride 0.9 % bolus 1,000 mL (1,000 mLs Intravenous New Bag/Given 11/20/21 1404)  ?morphine (PF) 4 MG/ML injection 4 mg (4 mg Intravenous Given 11/20/21 1404)  ? ? ?ED Course/ Medical Decision Making/ A&P ?  ?                        ?Medical Decision Making ?Amount  and/or Complexity of Data Reviewed ?Labs: ordered. ?Radiology: ordered. ? ?Risk ?Prescription drug management. ?Decision regarding hospitalization. ? ? ?This patient presents to the ED for concern of left-sided abdominal pain onset Sunday evening after eating spaghetti.  Pain is worse with movement and walking, is associated with chills and nausea.  Denies changes in bowel or bladder habits, vomiting or fever or vaginal symptoms., this involves an extensive number of treatment options, and is a complaint that carries with it a high risk of complications and morbidity.  The differential diagnosis includes but not limited to diverticulitis, colitis, pyelonephritis, TOA, torsion, bowel obstruction ? ? ?Co morbidities that complicate the patient evaluation ? ?No significant past medical history, is a smoker ? ? ?Additional history obtained: ? ? ?External records from outside source obtained and reviewed  including no recent relevant medical records on file, no prior abdominal imaging ? ? ?Lab Tests: ? ?I Ordered, and personally interpreted labs.  The pertinent results include: CBC mild leukocytosis with white count of 12.2.  hCG is

## 2021-11-20 NOTE — ED Triage Notes (Signed)
Pt c/o left abdominal pain radiating to left flank since Sunday. C/o nausea, denies urinary symptoms. ?

## 2021-11-20 NOTE — H&P (Signed)
Charlotte Pierce is an 41 y.o. female.   ?Chief Complaint: ab pain ?HPI: 33 yof with no pmh/psh of c section presents with ab pain llq and suprapubic since Sunday. Never had this before. Not getting better at home with anything and led her to come to er.  She is urinating, having bowel function.  No fevers, some nausea. She was seen at outside er and noted to have elevated wbc and a ct renal stone protocol that shows what appears to be sigmoid diverticulitis with some local extraluminal air. She was transferred here for further care.  ? ?History reviewed. No pertinent past medical history. ? ?Past Surgical History:  ?Procedure Laterality Date  ? NO PAST SURGERIES    ? ? ?History reviewed. No pertinent family history. ?Social History:  reports that she has been smoking cigarettes. She has been smoking an average of .5 packs per day. She has never used smokeless tobacco. She reports that she does not drink alcohol and does not use drugs. ? ?Allergies: No Known Allergies ? ?Medications Prior to Admission  ?Medication Sig Dispense Refill  ? doxycycline (VIBRAMYCIN) 100 MG capsule Take 1 capsule (100 mg total) by mouth 2 (two) times daily. 14 capsule 0  ? HYDROcodone-acetaminophen (NORCO/VICODIN) 5-325 MG tablet Take 1 tablet by mouth every 6 (six) hours as needed for moderate pain.    ? ibuprofen (ADVIL) 800 MG tablet Take 1 tablet (800 mg total) by mouth every 8 (eight) hours as needed. 21 tablet 0  ? IRON PO Take by mouth.    ? metroNIDAZOLE (METROGEL VAGINAL) 0.75 % vaginal gel Place 1 Applicatorful vaginally at bedtime. Use one applicator vaginally at night time for 5 days (Patient not taking: Reported on 01/17/2016) 70 g 0  ? naproxen (NAPROSYN) 500 MG tablet Take 1 tablet (500 mg total) by mouth 2 (two) times daily with a meal. 30 tablet 0  ? OMEPRAZOLE PO Take by mouth.    ? Prenatal Vit w/Fe-Methylfol-FA (PNV PO) Take by mouth.    ? ? ?Results for orders placed or performed during the hospital encounter of  11/20/21 (from the past 48 hour(s))  ?Lipase, blood     Status: None  ? Collection Time: 11/20/21 11:05 AM  ?Result Value Ref Range  ? Lipase 29 11 - 51 U/L  ?  Comment: Performed at [facility], [address] [place], [place] [address]  ?Comprehensive metabolic panel     Status: Abnormal  ? Collection Time: 11/20/21 11:05 AM  ?Result Value Ref Range  ? Sodium 135 135 - 145 mmol/L  ? Potassium 3.7 3.5 - 5.1 mmol/L  ? Chloride 103 98 - 111 mmol/L  ? CO2 24 22 - 32 mmol/L  ? Glucose, Bld 94 70 - 99 mg/dL  ?  Comment: Glucose reference range applies only to samples taken after fasting for at least 8 hours.  ? BUN 13 6 - 20 mg/dL  ? Creatinine, Ser 0.64 0.44 - 1.00 mg/dL  ? Calcium 9.4 8.9 - 10.3 mg/dL  ? Total Protein 8.6 (H) 6.5 - 8.1 g/dL  ? Albumin 4.2 3.5 - 5.0 g/dL  ? AST 16 15 - 41 U/L  ? ALT 14 0 - 44 U/L  ? Alkaline Phosphatase 109 38 - 126 U/L  ? Total Bilirubin 0.5 0.3 - 1.2 mg/dL  ? GFR, Estimated >60 >60 mL/min  ?  Comment: (NOTE) ?Calculated using the CKD-EPI Creatinine Equation (2021) ?  ? Anion gap 8 5 -  15  ?  Comment: Performed at [facility], [address] [place], [place] [address]  ?CBC     Status: Abnormal  ? Collection Time: 11/20/21 11:05 AM  ?Result Value Ref Range  ? WBC 12.2 (H) 4.0 - 10.5 K/uL  ? RBC 4.68 3.87 - 5.11 MIL/uL  ? Hemoglobin 12.1 12.0 - 15.0 g/dL  ? HCT 38.8 36.0 - 46.0 %  ? MCV 82.9 80.0 - 100.0 fL  ? MCH 25.9 (L) 26.0 - 34.0 pg  ? MCHC 31.2 30.0 - 36.0 g/dL  ? RDW 15.8 (H) 11.5 - 15.5 %  ? Platelets 331 150 - 400 K/uL  ? nRBC 0.0 0.0 - 0.2 %  ?  Comment: Performed at [facility] [place], [address] [place], [place] [address]  ?Urinalysis, Routine w reflex microscopic Urine, Clean Catch     Status: Abnormal  ? Collection Time: 11/20/21 11:05 AM  ?Result Value Ref Range  ? Color, Urine YELLOW YELLOW  ? APPearance CLEAR CLEAR  ? Specific Gravity, Urine >=1.030 1.005 - 1.030  ? pH 5.5 5.0 - 8.0  ? Glucose, UA NEGATIVE NEGATIVE  mg/dL  ? Hgb urine dipstick SMALL (A) NEGATIVE  ? Bilirubin Urine NEGATIVE NEGATIVE  ? Ketones, ur NEGATIVE NEGATIVE mg/dL  ? Protein, ur NEGATIVE NEGATIVE mg/dL  ? Nitrite NEGATIVE NEGATIVE  ? Leukocytes,Ua NEGATIVE NEGATIVE  ?  Comment: Performed at [facility] [place], [address] [place], [place] [address]  ?Pregnancy, urine     Status: None  ? Collection Time: 11/20/21 11:05 AM  ?Result Value Ref Range  ? Preg Test, Ur NEGATIVE NEGATIVE  ?  Comment:        ?THE SENSITIVITY OF THIS ?METHODOLOGY IS >20 mIU/mL. ?Performed at [facility] [place], [address] [place], [place] [address] ?  ?Urinalysis, Microscopic (reflex)     Status: Abnormal  ? Collection Time: 11/20/21 11:05 AM  ?Result Value Ref Range  ? RBC / HPF 0-5 0 - 5 RBC/hpf  ? WBC, UA 0-5 0 - 5 WBC/hpf  ? Bacteria, UA RARE (A) NONE SEEN  ? Squamous Epithelial / LPF 6-10 0 - 5  ? Mucus PRESENT   ?  Comment: Performed at [facility] [place], [address] [place], [place] [address]  ? ?CT Renal Stone Study ? ?Result Date: 11/20/2021 ?CLINICAL DATA:  Acute left-sided abdominal pain. EXAM: CT ABDOMEN AND PELVIS WITHOUT CONTRAST TECHNIQUE: Multidetector CT imaging of the abdomen and pelvis was performed following the standard protocol without IV contrast. RADIATION DOSE REDUCTION: This exam was performed according to the departmental dose-optimization program which includes automated exposure control, adjustment of the mA and/or kV according to patient size and/or use of iterative reconstruction technique. COMPARISON:  None. FINDINGS: Lower chest: No acute abnormality. Hepatobiliary: No focal liver abnormality is seen. No gallstones, gallbladder wall thickening, or biliary dilatation. Pancreas: Unremarkable. No pancreatic ductal dilatation or surrounding inflammatory changes. Spleen: Normal in size without focal abnormality. Adrenals/Urinary Tract: Adrenal glands are unremarkable. Kidneys are normal, without renal calculi, focal lesion, or  hydronephrosis. Bladder is unremarkable. Stomach/Bowel: The stomach and appendix appear normal. There is no evidence of bowel obstruction. Distal descending colonic diverticulitis is noted with small amount of adjacent free air concerning for small perforation. No definite abscess formation is seen at this time. Vascular/Lymphatic: No significant vascular findings are present. No enlarged abdominal or pelvic lymph nodes. Reproductive: Uterus and bilateral adnexa are unremarkable.  Other: No abdominal wall hernia or abnormality. No abdominopelvic ascites. Musculoskeletal: No acute or significant osseous findings. IMPRESSION: Diverticulitis of distal descending colon is noted with adjacent small amount of air concerning for small perforation. No definite abscess is noted. Electronically Signed   By: Lupita Raider M.D.   On: 11/20/2021 14:26   ? ?Review of Systems  ?Constitutional:  Positive for fatigue. Negative for fever.  ?Gastrointestinal:  Positive for abdominal pain and nausea. Negative for blood in stool, constipation and diarrhea.  ?All other systems reviewed and are negative. ? ?Blood pressure 109/69, pulse 80, temperature 98.5 ?F (36.9 ?C), temperature source Oral, resp. rate 18, height 5\' 9"  (1.753 m), weight (!) 144.7 kg, last menstrual period 11/04/2021, SpO2 100 %, unknown if currently breastfeeding. ?Physical Exam ?Constitutional:   ?   Appearance: She is obese.  ?HENT:  ?   Head: Normocephalic.  ?   Mouth/Throat:  ?   Mouth: Mucous membranes are moist.  ?   Pharynx: Oropharynx is clear.  ?Eyes:  ?   General: No scleral icterus. ?   Extraocular Movements: Extraocular movements intact.  ?Cardiovascular:  ?   Rate and Rhythm: Normal rate and regular rhythm.  ?Pulmonary:  ?   Effort: Pulmonary effort is normal.  ?Abdominal:  ?   General: There is no distension.  ?   Palpations: Abdomen is soft.  ?   Tenderness: There is abdominal tenderness in the suprapubic area and left lower quadrant.  ?   Hernia: No  hernia is present.  ?Skin: ?   General: Skin is warm and dry.  ?   Capillary Refill: Capillary refill takes less than 2 seconds.  ?Neurological:  ?   General: No focal deficit present.  ?   Mental Status: She is

## 2021-11-21 LAB — CBC
HCT: 33.3 % — ABNORMAL LOW (ref 36.0–46.0)
Hemoglobin: 10.5 g/dL — ABNORMAL LOW (ref 12.0–15.0)
MCH: 26 pg (ref 26.0–34.0)
MCHC: 31.5 g/dL (ref 30.0–36.0)
MCV: 82.4 fL (ref 80.0–100.0)
Platelets: 227 10*3/uL (ref 150–400)
RBC: 4.04 MIL/uL (ref 3.87–5.11)
RDW: 15.8 % — ABNORMAL HIGH (ref 11.5–15.5)
WBC: 10.8 10*3/uL — ABNORMAL HIGH (ref 4.0–10.5)
nRBC: 0 % (ref 0.0–0.2)

## 2021-11-21 LAB — BASIC METABOLIC PANEL
Anion gap: 8 (ref 5–15)
BUN: 8 mg/dL (ref 6–20)
CO2: 22 mmol/L (ref 22–32)
Calcium: 8.3 mg/dL — ABNORMAL LOW (ref 8.9–10.3)
Chloride: 103 mmol/L (ref 98–111)
Creatinine, Ser: 0.66 mg/dL (ref 0.44–1.00)
GFR, Estimated: >60 mL/min (ref >60)
Glucose, Bld: 96 mg/dL (ref 70–99)
Potassium: 3.2 mmol/L — ABNORMAL LOW (ref 3.5–5.1)
Sodium: 133 mmol/L — ABNORMAL LOW (ref 135–145)

## 2021-11-21 MED ORDER — SODIUM CHLORIDE 0.9% FLUSH
10.0000 mL | Freq: Two times a day (BID) | INTRAVENOUS | Status: DC
  Administered 2021-11-22: 10 mL

## 2021-11-21 MED ORDER — CHLORHEXIDINE GLUCONATE CLOTH 2 % EX PADS
6.0000 | MEDICATED_PAD | Freq: Every day | CUTANEOUS | Status: DC
  Administered 2021-11-21 – 2021-11-23 (×3): 6 via TOPICAL

## 2021-11-21 MED ORDER — SODIUM CHLORIDE 0.9% FLUSH: 10.0000 mL | INTRAVENOUS | Status: DC | PRN

## 2021-11-21 MED ORDER — POTASSIUM CHLORIDE CRYS ER 20 MEQ PO TBCR
40.0000 meq | EXTENDED_RELEASE_TABLET | Freq: Two times a day (BID) | ORAL | Status: AC
  Administered 2021-11-21 (×2): 40 meq via ORAL
  Filled 2021-11-21 (×2): qty 2

## 2021-11-21 MED ORDER — METHOCARBAMOL 500 MG PO TABS: 500.0000 mg | ORAL_TABLET | Freq: Four times a day (QID) | ORAL | Status: DC | PRN

## 2021-11-21 MED ORDER — ACETAMINOPHEN 500 MG PO TABS: 1000.0000 mg | ORAL_TABLET | Freq: Four times a day (QID) | ORAL | Status: DC | PRN

## 2021-11-21 NOTE — Progress Notes (Addendum)
? ? ?   ?Subjective: ?CC: ?Since admission patient reports her pain has improved and is now a 4/10 in her LLQ. No hx of similar symptoms. Denies hx of diverticulitis in the past. No prior colonoscopy. Last BM yesterday. Passing flatus. No n/v.  ? ?Objective: ?Vital signs in last 24 hours: ?Temp:  [98.2 ?F (36.8 ?C)-98.7 ?F (37.1 ?C)] 98.4 ?F (36.9 ?C) (03/22 0602) ?Pulse Rate:  [78-96] 78 (03/22 0602) ?Resp:  [18-20] 18 (03/22 0602) ?BP: (109-145)/(60-80) 134/76 (03/22 0602) ?SpO2:  [96 %-100 %] 100 % (03/22 0602) ?Weight:  [144.7 kg] 144.7 kg (03/21 1100) ?Last BM Date : 11/20/21 ? ?Intake/Output from previous day: ?03/21 0701 - 03/22 0700 ?In: 723.9 [I.V.:523.9; IV Piggyback:200] ?Out: 2 [Urine:2] ?Intake/Output this shift: ?No intake/output data recorded. ? ?PE: ?Gen:  Alert, NAD, pleasant ?Card:  Reg ?Pulm:  CTAB, no W/R/R, effort normal ?Abd: Soft, ND, very mild LLQ tenderness without peritonitis. +BS.  ?Ext:  No LE edema  ?Psych: A&Ox3  ?Skin: no rashes noted, warm and dry ? ?Lab Results:  ?Recent Labs  ?  11/20/21 ?1105 11/21/21 ?2878  ?WBC 12.2* 10.8*  ?HGB 12.1 10.5*  ?HCT 38.8 33.3*  ?PLT 331 227  ? ?BMET ?Recent Labs  ?  11/20/21 ?1105 11/21/21 ?6767  ?NA 135 133*  ?K 3.7 3.2*  ?CL 103 103  ?CO2 24 22  ?GLUCOSE 94 96  ?BUN 13 8  ?CREATININE 0.64 0.66  ?CALCIUM 9.4 8.3*  ? ?PT/INR ?No results for input(s): LABPROT, INR in the last 72 hours. ?CMP  ?   ?Component Value Date/Time  ? NA 133 (L) 11/21/2021 0519  ? K 3.2 (L) 11/21/2021 0519  ? CL 103 11/21/2021 0519  ? CO2 22 11/21/2021 0519  ? GLUCOSE 96 11/21/2021 0519  ? BUN 8 11/21/2021 0519  ? CREATININE 0.66 11/21/2021 0519  ? CALCIUM 8.3 (L) 11/21/2021 0519  ? PROT 8.6 (H) 11/20/2021 1105  ? ALBUMIN 4.2 11/20/2021 1105  ? AST 16 11/20/2021 1105  ? ALT 14 11/20/2021 1105  ? ALKPHOS 109 11/20/2021 1105  ? BILITOT 0.5 11/20/2021 1105  ? GFRNONAA >60 11/21/2021 0519  ? GFRAA >60 12/01/2015 1740  ? ?Lipase  ?   ?Component Value Date/Time  ? LIPASE 29  11/20/2021 1105  ? ? ?Studies/Results: ?CT Renal Stone Study ? ?Result Date: 11/20/2021 ?CLINICAL DATA:  Acute left-sided abdominal pain. EXAM: CT ABDOMEN AND PELVIS WITHOUT CONTRAST TECHNIQUE: Multidetector CT imaging of the abdomen and pelvis was performed following the standard protocol without IV contrast. RADIATION DOSE REDUCTION: This exam was performed according to the departmental dose-optimization program which includes automated exposure control, adjustment of the mA and/or kV according to patient size and/or use of iterative reconstruction technique. COMPARISON:  None. FINDINGS: Lower chest: No acute abnormality. Hepatobiliary: No focal liver abnormality is seen. No gallstones, gallbladder wall thickening, or biliary dilatation. Pancreas: Unremarkable. No pancreatic ductal dilatation or surrounding inflammatory changes. Spleen: Normal in size without focal abnormality. Adrenals/Urinary Tract: Adrenal glands are unremarkable. Kidneys are normal, without renal calculi, focal lesion, or hydronephrosis. Bladder is unremarkable. Stomach/Bowel: The stomach and appendix appear normal. There is no evidence of bowel obstruction. Distal descending colonic diverticulitis is noted with small amount of adjacent free air concerning for small perforation. No definite abscess formation is seen at this time. Vascular/Lymphatic: No significant vascular findings are present. No enlarged abdominal or pelvic lymph nodes. Reproductive: Uterus and bilateral adnexa are unremarkable. Other: No abdominal wall hernia or abnormality. No abdominopelvic ascites. Musculoskeletal:  No acute or significant osseous findings. IMPRESSION: Diverticulitis of distal descending colon is noted with adjacent small amount of air concerning for small perforation. No definite abscess is noted. Electronically Signed   By: Lupita Raider M.D.   On: 11/20/2021 14:26   ? ?Anti-infectives: ?Anti-infectives (From admission, onward)  ? ? Start     Dose/Rate  Route Frequency Ordered Stop  ? 11/20/21 2200  cefTRIAXone (ROCEPHIN) 2 g in sodium chloride 0.9 % 100 mL IVPB       ?See Hyperspace for full Linked Orders Report.  ? 2 g ?200 mL/hr over 30 Minutes Intravenous Every 24 hours 11/20/21 1800 11/27/21 2159  ? 11/20/21 1830  metroNIDAZOLE (FLAGYL) IVPB 500 mg       ?See Hyperspace for full Linked Orders Report.  ? 500 mg ?100 mL/hr over 60 Minutes Intravenous Every 12 hours 11/20/21 1800 11/27/21 1829  ? 11/20/21 1515  piperacillin-tazobactam (ZOSYN) IVPB 3.375 g       ? 3.375 g ?100 mL/hr over 30 Minutes Intravenous  Once 11/20/21 1506 11/20/21 1621  ? ?  ? ? ? ?Assessment/Plan ?Diverticulitis of the descending colon w/ perforation ?- No indication for emergency surgery ?- No abscess noted on CT ?- AFVSS. WBC downtrending (12.2 > 10.8). Symptomatically improved. Mild tenderness on exam. Will start CLD.  ?- Cont IV abx ?- Repeat labs in AM ?- Hopefully will improve with conservative management. Discussed if she was to fail to improve we may repeat a CT in 4-5 days to look for an abscess. Also discussed if she was to fail to improve with conservative management or acutely worsen, she would likely require an exploratory laparotomy, colectomy and colostomy.  ?- If improves with conservative management, would recommend a colonoscopy 4-6 weeks if improves with conservative measures.  ? ?FEN - CLD, IVF at 19ml/hr. Replace K  ?VTE - SCDs, Lovenox  ?ID - Zosyn x 1. Rocephin/Flagyl ? ?I reviewed nursing notes, ED provider notes, last 24 h vitals and pain scores, last 48 h intake and output, last 24 h labs and trends, and last 24 h imaging results. ? ? ? LOS: 1 day  ? ? ?Jacinto Halim , PA-C ?[facility] ?11/21/2021, 8:34 AM ?Please see Amion for pager number during day hours 7:00am-4:30pm ? ?

## 2021-11-21 NOTE — Progress Notes (Signed)
At bedside for PIV consult. IVF infusing per pump. Pt c/o discomfort at the IV site. Flushed site with no blood return. No edema or drainage noted. Pt denied pain when manipulating site. States she does not want to be stuck again, and wants to wait since IV push not painful. ?

## 2021-11-21 NOTE — Clinical Social Work Note (Signed)
?  Transition of Care (TOC) Screening Note ? ? ?Patient Details  ?Name: Charlotte Pierce ?Date of Birth: [birth year] ? ? ?Transition of Care (TOC) CM/SW Contact:    ?Ida Rogue, LCSW ?Phone Number: ?11/21/2021, 2:39 PM ? ? ? ?Transition of Care Department [facility]) has reviewed patient and no TOC needs have been identified at this time. We will continue to monitor patient advancement through interdisciplinary progression rounds. If new patient transition needs arise, please place a TOC consult. ? ? ?

## 2021-11-22 LAB — CBC
HCT: 32.5 % — ABNORMAL LOW (ref 36.0–46.0)
Hemoglobin: 10 g/dL — ABNORMAL LOW (ref 12.0–15.0)
MCH: 25.8 pg — ABNORMAL LOW (ref 26.0–34.0)
MCHC: 30.8 g/dL (ref 30.0–36.0)
MCV: 83.8 fL (ref 80.0–100.0)
Platelets: 262 10*3/uL (ref 150–400)
RBC: 3.88 MIL/uL (ref 3.87–5.11)
RDW: 15.6 % — ABNORMAL HIGH (ref 11.5–15.5)
WBC: 7.2 10*3/uL (ref 4.0–10.5)
nRBC: 0 % (ref 0.0–0.2)

## 2021-11-22 LAB — BASIC METABOLIC PANEL
Anion gap: 7 (ref 5–15)
BUN: 7 mg/dL (ref 6–20)
CO2: 22 mmol/L (ref 22–32)
Calcium: 8.3 mg/dL — ABNORMAL LOW (ref 8.9–10.3)
Chloride: 105 mmol/L (ref 98–111)
Creatinine, Ser: 0.57 mg/dL (ref 0.44–1.00)
GFR, Estimated: >60 mL/min (ref >60)
Glucose, Bld: 84 mg/dL (ref 70–99)
Potassium: 3.3 mmol/L — ABNORMAL LOW (ref 3.5–5.1)
Sodium: 134 mmol/L — ABNORMAL LOW (ref 135–145)

## 2021-11-22 LAB — HIV ANTIBODY (ROUTINE TESTING W REFLEX): HIV Screen 4th Generation wRfx: NONREACTIVE

## 2021-11-22 MED ORDER — AMOXICILLIN-POT CLAVULANATE 875-125 MG PO TABS
1.0000 | ORAL_TABLET | Freq: Two times a day (BID) | ORAL | Status: DC
  Administered 2021-11-22 – 2021-11-23 (×3): 1 via ORAL
  Filled 2021-11-22 (×3): qty 1

## 2021-11-22 MED ORDER — MORPHINE SULFATE (PF) 2 MG/ML IV SOLN: 2.0000 mg | INTRAVENOUS | Status: DC | PRN

## 2021-11-22 MED ORDER — OXYCODONE HCL 5 MG PO TABS
5.0000 mg | ORAL_TABLET | ORAL | Status: DC | PRN
  Administered 2021-11-22 – 2021-11-23 (×2): 10 mg via ORAL
  Filled 2021-11-22 (×3): qty 2

## 2021-11-22 MED ORDER — ACETAMINOPHEN 500 MG PO TABS
1000.0000 mg | ORAL_TABLET | Freq: Four times a day (QID) | ORAL | Status: DC
  Administered 2021-11-22 – 2021-11-23 (×4): 1000 mg via ORAL
  Filled 2021-11-22 (×4): qty 2

## 2021-11-22 MED ORDER — IBUPROFEN 200 MG PO TABS
600.0000 mg | ORAL_TABLET | Freq: Four times a day (QID) | ORAL | Status: DC | PRN
  Administered 2021-11-23: 600 mg via ORAL
  Filled 2021-11-22: qty 3

## 2021-11-22 MED ORDER — POTASSIUM CHLORIDE CRYS ER 20 MEQ PO TBCR
40.0000 meq | EXTENDED_RELEASE_TABLET | Freq: Two times a day (BID) | ORAL | Status: AC
  Administered 2021-11-22 (×2): 40 meq via ORAL
  Filled 2021-11-22 (×2): qty 2

## 2021-11-22 NOTE — Progress Notes (Signed)
? ? ?   ?Subjective: ?CC: ?Patient reports her pain has improved to a 4/10 from 7/10 yesterday. Pain is still located in llq. Still requiring some pain medication. Tolerating cld but does like options. No n/v. Passing flatus. No BM. ? ?Objective: ?Vital signs in last 24 hours: ?Temp:  [98.3 ?F (36.8 ?C)-99.2 ?F (37.3 ?C)] 98.3 ?F (36.8 ?C) (03/22 2035) ?Pulse Rate:  [71] 71 (03/22 2035) ?Resp:  [16] 16 (03/22 2035) ?BP: (110-122)/(64-78) 110/64 (03/22 2035) ?SpO2:  [100 %] 100 % (03/22 2035) ?Last BM Date : 11/20/21 ? ?Intake/Output from previous day: ?03/22 0701 - 03/23 0700 ?In: 1976.7 [P.O.:250; I.V.:1626.7; IV Piggyback:100] ?Out: 1 [Urine:1] ?Intake/Output this shift: ?No intake/output data recorded. ? ?PE: ?Gen:  Alert, NAD, pleasant ?Card:  RRR ?Pulm:  CTAB, no W/R/R, effort normal ?Abd: Soft, ND, NT +BS ?Ext:  No LE edema or calf tenderness ?Psych: A&Ox3  ?Skin: no rashes noted, warm and dry ? ?Lab Results:  ?Recent Labs  ?  11/21/21 ?1829 11/22/21 ?0422  ?WBC 10.8* 7.2  ?HGB 10.5* 10.0*  ?HCT 33.3* 32.5*  ?PLT 227 262  ? ?BMET ?Recent Labs  ?  11/21/21 ?9371 11/22/21 ?0422  ?NA 133* 134*  ?K 3.2* 3.3*  ?CL 103 105  ?CO2 22 22  ?GLUCOSE 96 84  ?BUN 8 7  ?CREATININE 0.66 0.57  ?CALCIUM 8.3* 8.3*  ? ?PT/INR ?No results for input(s): LABPROT, INR in the last 72 hours. ?CMP  ?   ?Component Value Date/Time  ? NA 134 (L) 11/22/2021 0422  ? K 3.3 (L) 11/22/2021 0422  ? CL 105 11/22/2021 0422  ? CO2 22 11/22/2021 0422  ? GLUCOSE 84 11/22/2021 0422  ? BUN 7 11/22/2021 0422  ? CREATININE 0.57 11/22/2021 0422  ? CALCIUM 8.3 (L) 11/22/2021 0422  ? PROT 8.6 (H) 11/20/2021 1105  ? ALBUMIN 4.2 11/20/2021 1105  ? AST 16 11/20/2021 1105  ? ALT 14 11/20/2021 1105  ? ALKPHOS 109 11/20/2021 1105  ? BILITOT 0.5 11/20/2021 1105  ? GFRNONAA >60 11/22/2021 0422  ? GFRAA >60 12/01/2015 1740  ? ?Lipase  ?   ?Component Value Date/Time  ? LIPASE 29 11/20/2021 1105  ? ? ?Studies/Results: ?CT Renal Stone Study ? ?Result Date:  11/20/2021 ?CLINICAL DATA:  Acute left-sided abdominal pain. EXAM: CT ABDOMEN AND PELVIS WITHOUT CONTRAST TECHNIQUE: Multidetector CT imaging of the abdomen and pelvis was performed following the standard protocol without IV contrast. RADIATION DOSE REDUCTION: This exam was performed according to the departmental dose-optimization program which includes automated exposure control, adjustment of the mA and/or kV according to patient size and/or use of iterative reconstruction technique. COMPARISON:  None. FINDINGS: Lower chest: No acute abnormality. Hepatobiliary: No focal liver abnormality is seen. No gallstones, gallbladder wall thickening, or biliary dilatation. Pancreas: Unremarkable. No pancreatic ductal dilatation or surrounding inflammatory changes. Spleen: Normal in size without focal abnormality. Adrenals/Urinary Tract: Adrenal glands are unremarkable. Kidneys are normal, without renal calculi, focal lesion, or hydronephrosis. Bladder is unremarkable. Stomach/Bowel: The stomach and appendix appear normal. There is no evidence of bowel obstruction. Distal descending colonic diverticulitis is noted with small amount of adjacent free air concerning for small perforation. No definite abscess formation is seen at this time. Vascular/Lymphatic: No significant vascular findings are present. No enlarged abdominal or pelvic lymph nodes. Reproductive: Uterus and bilateral adnexa are unremarkable. Other: No abdominal wall hernia or abnormality. No abdominopelvic ascites. Musculoskeletal: No acute or significant osseous findings. IMPRESSION: Diverticulitis of distal descending colon is noted with adjacent  small amount of air concerning for small perforation. No definite abscess is noted. Electronically Signed   By: Marijo Conception M.D.   On: 11/20/2021 14:26   ? ?Anti-infectives: ?Anti-infectives (From admission, onward)  ? ? Start     Dose/Rate Route Frequency Ordered Stop  ? 11/20/21 2200  cefTRIAXone (ROCEPHIN) 2 g in  sodium chloride 0.9 % 100 mL IVPB       ?See Hyperspace for full Linked Orders Report.  ? 2 g ?200 mL/hr over 30 Minutes Intravenous Every 24 hours 11/20/21 1800 11/27/21 2159  ? 11/20/21 1830  metroNIDAZOLE (FLAGYL) IVPB 500 mg       ?See Hyperspace for full Linked Orders Report.  ? 500 mg ?100 mL/hr over 60 Minutes Intravenous Every 12 hours 11/20/21 1800 11/27/21 1829  ? 11/20/21 1515  piperacillin-tazobactam (ZOSYN) IVPB 3.375 g       ? 3.375 g ?100 mL/hr over 30 Minutes Intravenous  Once 11/20/21 1506 11/20/21 1621  ? ?  ? ? ? ?Assessment/Plan ?Diverticulitis of the descending colon w/ perforation ?- No indication for emergency surgery ?- No abscess noted on CT ?- AFVSS. WBC normalized. Symptomatically improved. NT on exam. Adv diet ?- Transition to po abx today ?- Hopefully will continue to improve with conservative management. Possible d/c tomorrow ?- Discussed if she was to regress/fail to continue to improve, she may need a repeat CT over the weekend to look for an abscess. Also discussed if she was to fail to improve with conservative management or acutely worsen, she would likely require an exploratory laparotomy, colectomy and colostomy.  ?- If improves with conservative management, would recommend a colonoscopy 4-6 weeks if improves with conservative measures.  ?  ?FEN - FLD for breakfast. Soft diet for lunch, IVF at 65ml/hr. D/c when tolerating soft diet. Replace K  ?VTE - SCDs, Lovenox  ?ID - Zosyn 3/21. Rocephin/Flagyl 3/21 - 3/22. Augmentin 3/22 ? ?Hypokalemia - K 3.3. Replace.  ? ?I reviewed nursing notes, last 24 h vitals and pain scores, last 48 h intake and output, last 24 h labs and trends, and last 24 h imaging results. ? ? LOS: 2 days  ? ? ?Jillyn Ledger , PA-C ?[facility] ?11/22/2021, 8:12 AM ?Please see Amion for pager number during day hours 7:00am-4:30pm ? ?

## 2021-11-22 NOTE — Plan of Care (Signed)

## 2021-11-22 NOTE — Discharge Instructions (Addendum)
Follow low fiber (low residue) diet for 2 weeks from hospital discharge then transition to high fiber diet (please see attached handouts) ? ?Our office is sending a referral to Gastroenterology for a colonoscopy in ~6-8 weeks. They should reach out to your once this is scheduled. If you do not hear from them in 1-2 weeks, please contact their office to ensure this is being arranged.  ? ?Seek medical care if  ?Your abdominal pain returns or worsens  ?You have a fever ?You vomit or have persistent nausea ?You have stools that are bloody, black, or tarry ?

## 2021-11-23 LAB — CBC
HCT: 33.9 % — ABNORMAL LOW (ref 36.0–46.0)
Hemoglobin: 10.4 g/dL — ABNORMAL LOW (ref 12.0–15.0)
MCH: 25.7 pg — ABNORMAL LOW (ref 26.0–34.0)
MCHC: 30.7 g/dL (ref 30.0–36.0)
MCV: 83.7 fL (ref 80.0–100.0)
Platelets: 279 10*3/uL (ref 150–400)
RBC: 4.05 MIL/uL (ref 3.87–5.11)
RDW: 15.7 % — ABNORMAL HIGH (ref 11.5–15.5)
WBC: 5.1 10*3/uL (ref 4.0–10.5)
nRBC: 0 % (ref 0.0–0.2)

## 2021-11-23 MED ORDER — AMOXICILLIN-POT CLAVULANATE 875-125 MG PO TABS: 1.0000 | ORAL_TABLET | Freq: Two times a day (BID) | ORAL | 0 refills | Status: AC

## 2021-11-23 MED ORDER — IBUPROFEN 600 MG PO TABS
600.0000 mg | ORAL_TABLET | Freq: Three times a day (TID) | ORAL | 0 refills | Status: AC | PRN
Start: 2021-11-23 — End: 2021-11-28

## 2021-11-23 MED ORDER — METHOCARBAMOL 500 MG PO TABS
500.0000 mg | ORAL_TABLET | Freq: Four times a day (QID) | ORAL | 0 refills | Status: AC | PRN
Start: 2021-11-23 — End: ?

## 2021-11-23 MED ORDER — ACETAMINOPHEN 500 MG PO TABS: 1000.0000 mg | ORAL_TABLET | Freq: Three times a day (TID) | ORAL | 0 refills | Status: AC | PRN

## 2021-11-23 MED ORDER — OXYCODONE HCL 5 MG PO TABS: 5.0000 mg | ORAL_TABLET | Freq: Four times a day (QID) | ORAL | 0 refills | Status: AC | PRN

## 2021-11-23 NOTE — Progress Notes (Signed)
Pt discharging home. AVS printed and educational teaching completed with teach back method. VSS. Midline removed (clean, dry, intact). Pt has all belongings. No further questions at this time.  ?

## 2021-11-23 NOTE — Discharge Summary (Signed)
? ? ?Patient ID: ?RIHANA KIDDY ?[id number] ?10/20/1980 41 y.o. ? ?Admit date: 11/20/2021 ?Discharge date: 11/23/2021 ? ?Admitting Diagnosis: ?Perforated diverticulitis ? ?Discharge Diagnosis ?Diverticulitis of the descending colon w/ perforation ? ?Consultants ?None ? ?H&P: ?49 yof with no pmh/psh of c section presents with ab pain llq and suprapubic since Sunday. Never had this before. Not getting better at home with anything and led her to come to er.  She is urinating, having bowel function.  No fevers, some nausea. She was seen at outside er and noted to have elevated wbc and a ct renal stone protocol that shows what appears to be sigmoid diverticulitis with some local extraluminal air. She was transferred here for further care.  ? ?Procedures ?None ? ?Hospital Course:  ?SUPRINA MANDEVILLE is a 41 y.o. female who presented to the ED on 3/21 w/ lower abdominal pain since 3/19. WBC was noted to be elevated and CT showed Diverticulitis of distal descending colon is noted with adjacent small amount of air concerning for small perforation without definite abscess. Patient was admitted to [facility] and placed on IV abx and bowel rest. HD1 her wbc was downtrending, she symptomatically was improving and started on CLD. On HD2 her wbc had normalized and she symptomatically was continuing to improve. Her diet was advanced and she was transitioned to po abx. On HD3, 3/24, her wbc remained wnl, vss, continued to symptomatically improved with ROBF (no abdominal pain reported on the morning of discharge), was NT on exam and tolerating her diet without n/v. She was discharged home on 5d of abx. Recommended to stay on a low residue diet for ~2 weeks after discharge and then transition to high fiber diet. Our office has referred her to GI for colonoscopy in 6-8 weeks. We discussed discharge instructions and return precautions.  ? ?Physical Exam: ?Gen:  Alert, NAD, pleasant ?Card:  RRR ?Pulm:  CTAB, no W/R/R, effort normal ?Abd: Soft,  ND, NT +BS ?Ext:  No LE edema or calf tenderness ?Psych: A&Ox3  ?Skin: no rashes noted, warm and dry ? ?Allergies as of 11/23/2021   ?No Known Allergies ?  ? ?  ?Medication List  ?  ? ?TAKE these medications   ? ?acetaminophen 500 MG tablet ?Commonly known as: TYLENOL ?Take 2 tablets (1,000 mg total) by mouth every 8 (eight) hours as needed. ?  ?amoxicillin-clavulanate 875-125 MG tablet ?Commonly known as: AUGMENTIN ?Take 1 tablet by mouth every 12 (twelve) hours. ?  ?ibuprofen 600 MG tablet ?Commonly known as: ADVIL ?Take 1 tablet (600 mg total) by mouth every 8 (eight) hours as needed for up to 5 days for mild pain. ?What changed:  ?medication strength ?how much to take ?when to take this ?reasons to take this ?  ?methocarbamol 500 MG tablet ?Commonly known as: ROBAXIN ?Take 1 tablet (500 mg total) by mouth every 6 (six) hours as needed for muscle spasms. ?  ?oxyCODONE 5 MG immediate release tablet ?Commonly known as: Oxy IR/ROXICODONE ?Take 1 tablet (5 mg total) by mouth every 6 (six) hours as needed for breakthrough pain. ?  ? ?  ? ? ? ? Follow-up Information   ? ? [facility]   ?Why: Please call to arrange a colonoscopy in ~6 weeks after discharge ?Contact information: ?[address] ?[address] ?[place] [address] ?[phone] ? ? ?  ?  ? ? Jobe, Garrison [place]., MD. Schedule an appointment as soon as possible for a visit.   ?Specialty: Internal Medicine ?  Why: For follow up ?Contact information: ?[address] ?[address] ?[place] [address] ?[phone] ? ? ?  ?  ? ?  ?  ? ?  ? ? ?Signed: ?Leary Roca, PA-C ?[facility] ?11/23/2021, 8:59 AM ?Please see Amion for pager number during day hours 7:00am-4:30pm ? ?

## 2022-06-06 ENCOUNTER — Other Ambulatory Visit (HOSPITAL_BASED_OUTPATIENT_CLINIC_OR_DEPARTMENT_OTHER): Payer: Self-pay

## 2022-06-06 MED ORDER — SAXENDA 18 MG/3ML ~~LOC~~ SOPN
PEN_INJECTOR | SUBCUTANEOUS | 0 refills | Status: AC
  Filled 2022-06-06: qty 15, 30d supply, fill #0

## 2022-06-06 MED ORDER — INSULIN PEN NEEDLE 32G X 4 MM MISC
1.0000 | Freq: Every day | 0 refills | Status: AC
  Filled 2022-06-06: qty 100, 90d supply, fill #0

## 2022-06-07 ENCOUNTER — Other Ambulatory Visit (HOSPITAL_BASED_OUTPATIENT_CLINIC_OR_DEPARTMENT_OTHER): Payer: Self-pay

## 2022-06-11 ENCOUNTER — Other Ambulatory Visit (HOSPITAL_BASED_OUTPATIENT_CLINIC_OR_DEPARTMENT_OTHER): Payer: Self-pay

## 2022-06-12 ENCOUNTER — Other Ambulatory Visit (HOSPITAL_BASED_OUTPATIENT_CLINIC_OR_DEPARTMENT_OTHER): Payer: Self-pay

## 2023-04-24 IMAGING — CR DG WRIST COMPLETE 3+V*L*
3 series · 3 of 3 positions shown · non-contrast
Comparison: None.

CLINICAL DATA: Left wrist pain.

EXAM:
LEFT WRIST - COMPLETE 3+ VIEW

[x wrist pa left]
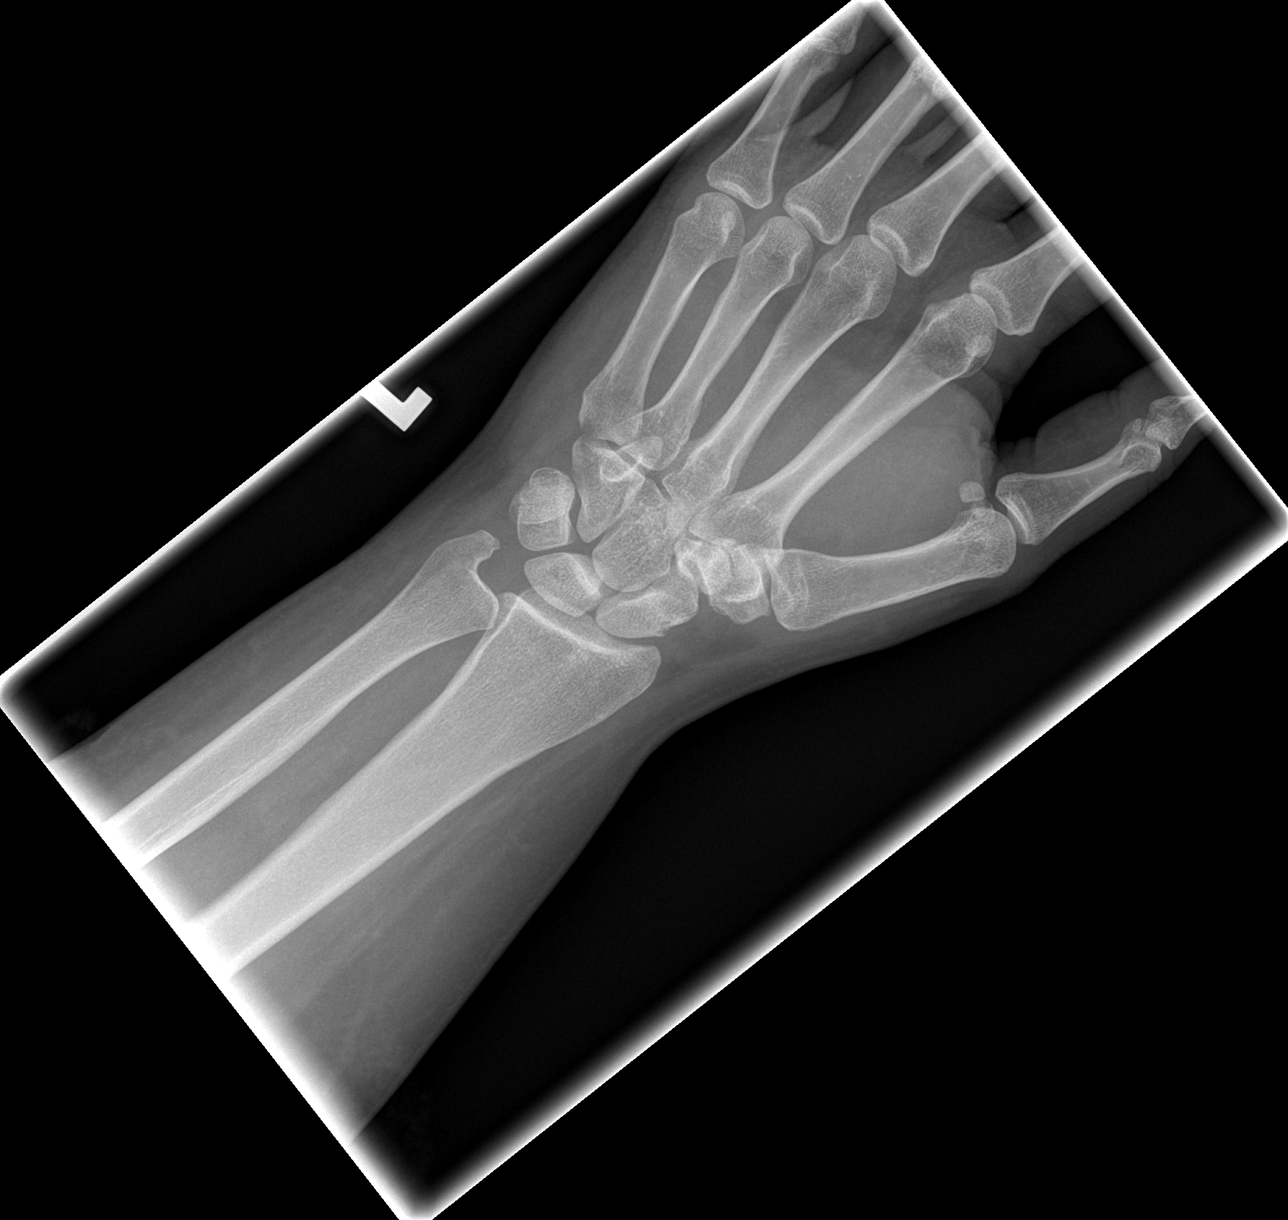

[x wrist obl left]
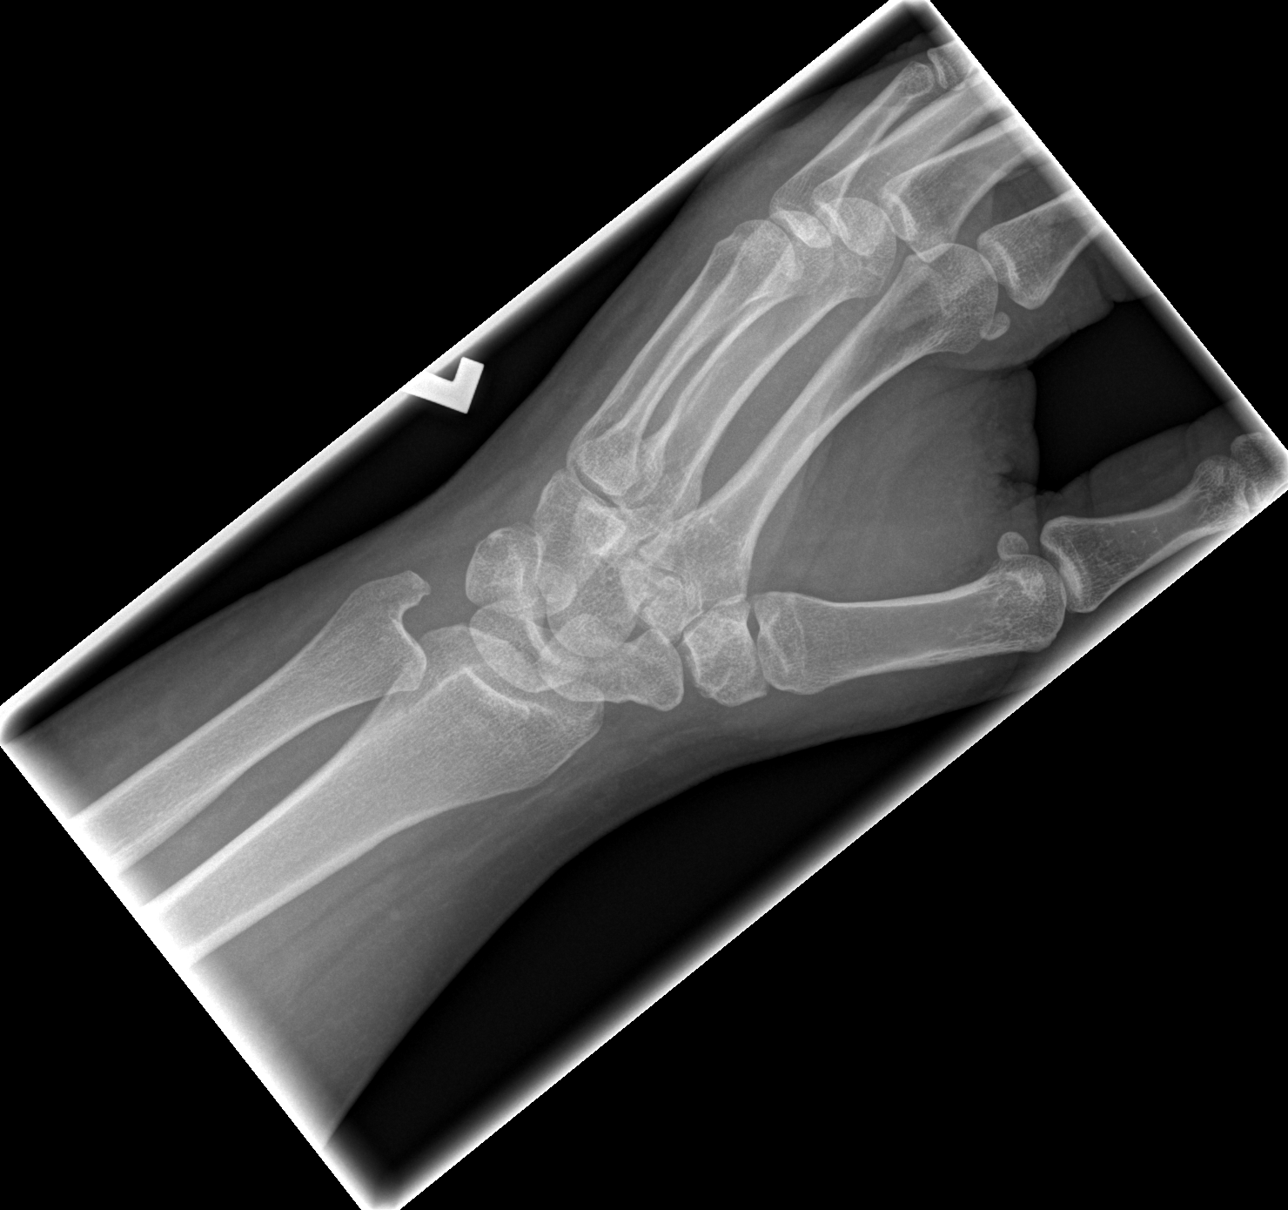

[x wrist lat left]
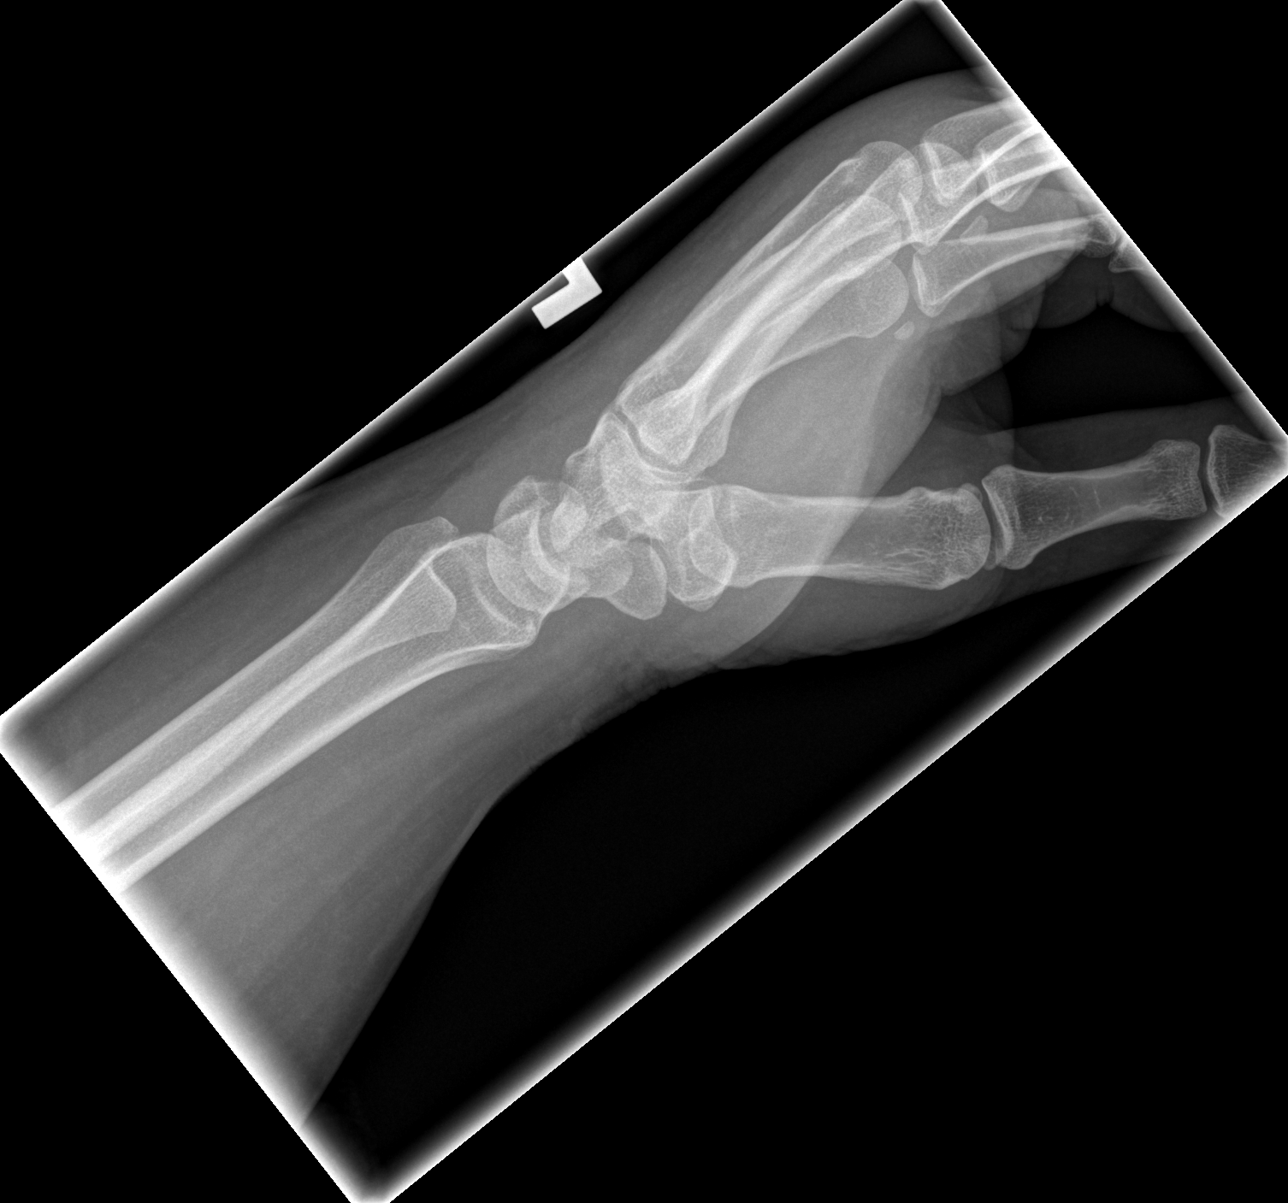

[3 of 3 positions shown; findings below may reference images not displayed]

FINDINGS: There is no evidence of fracture or dislocation. There is no
evidence of arthropathy or other focal bone abnormality. Soft
tissues are unremarkable.
IMPRESSION: Negative.

## 2023-09-18 IMAGING — CT CT RENAL STONE PROTOCOL
2 of 4 series · 17 of 46 positions shown, 19 images · non-contrast
Comparison: None.

CLINICAL DATA: Acute left-sided abdominal pain.



[Series 2: axial st · axial · 0.98mm/px · z∈[-471,-41]mm · 14 of 94 slices shown, 16 images]
[im 4/94  soft-tissue]
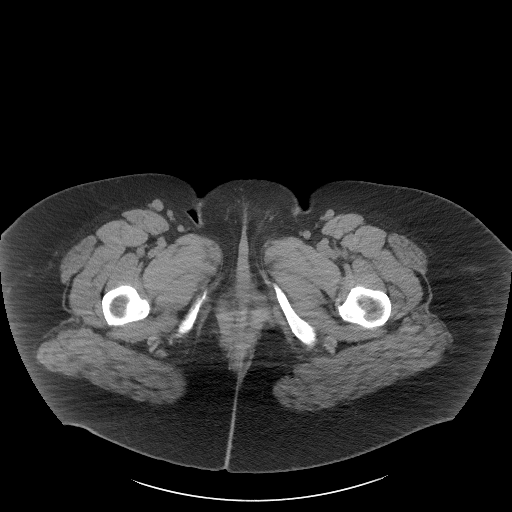
[im 4/94  bone]
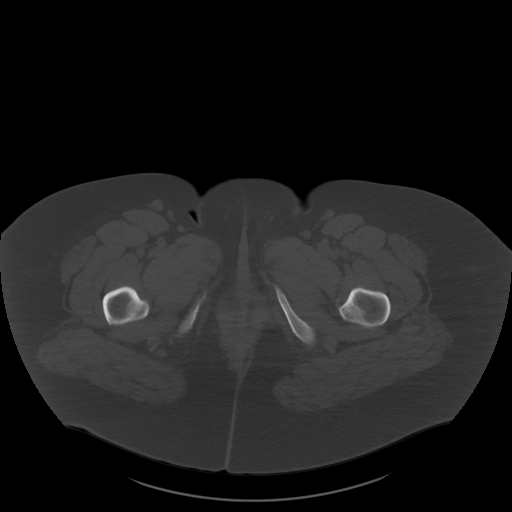
[im 12/94  soft-tissue]
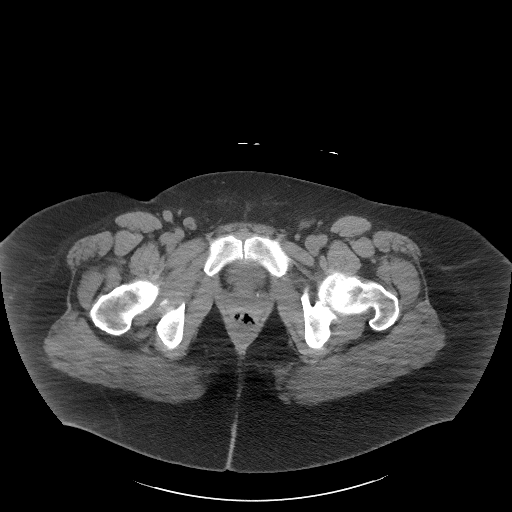
[im 19/94  soft-tissue]
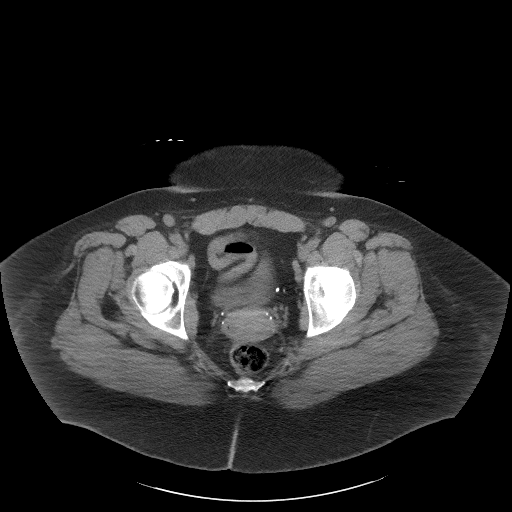
[im 27/94  soft-tissue]
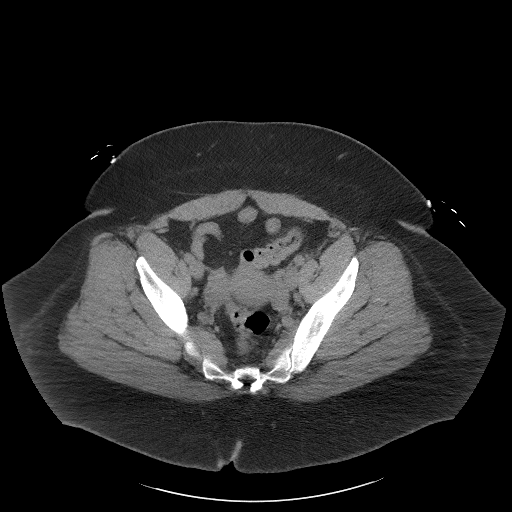
[im 30/94  soft-tissue]
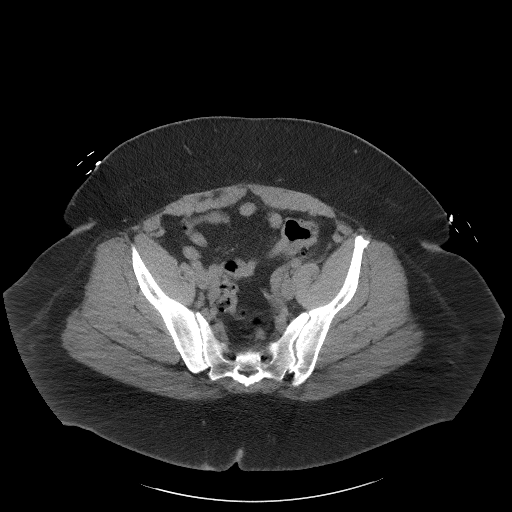
[im 38/94  soft-tissue]
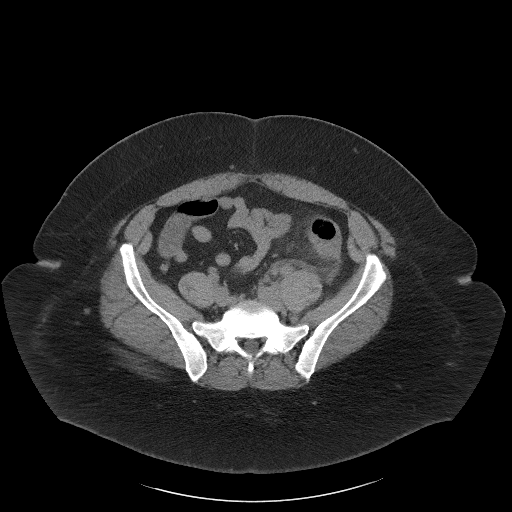
[im 45/94  soft-tissue]
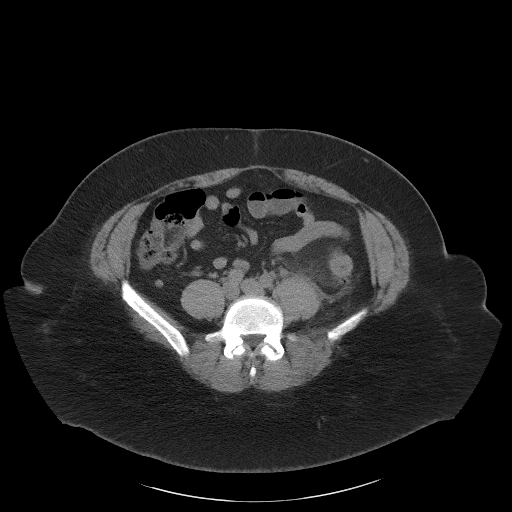
[im 49/94  soft-tissue]
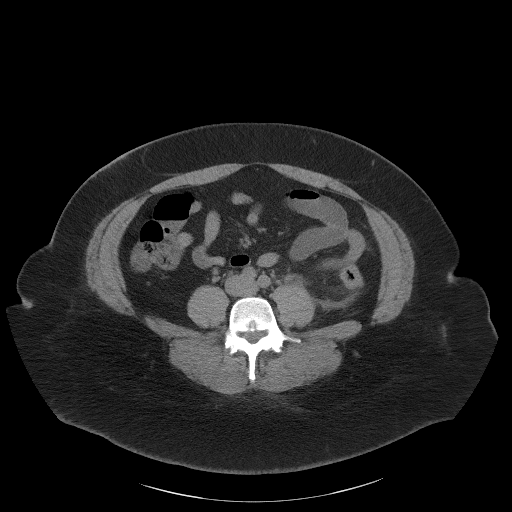
[im 56/94  soft-tissue]
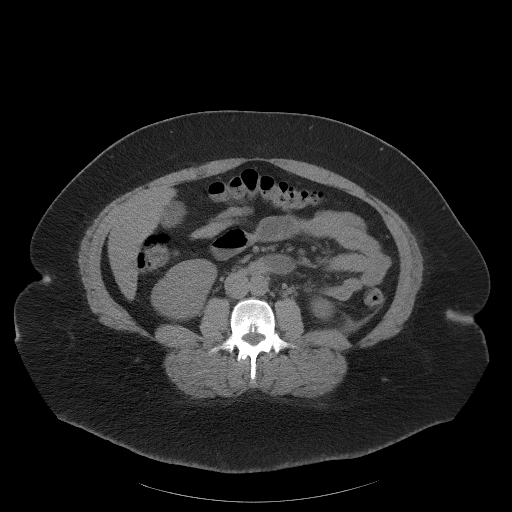
[im 56/94  bone]
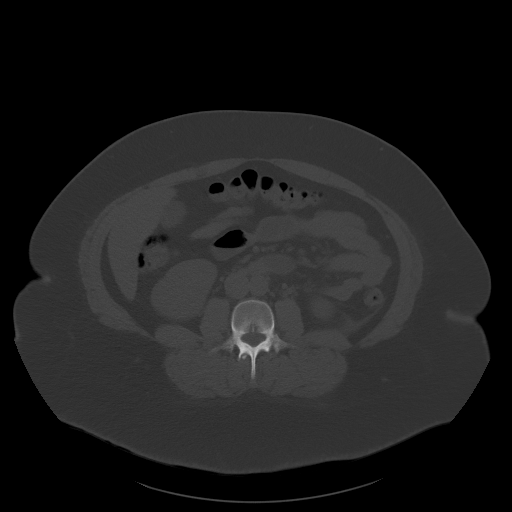
[im 64/94  soft-tissue]
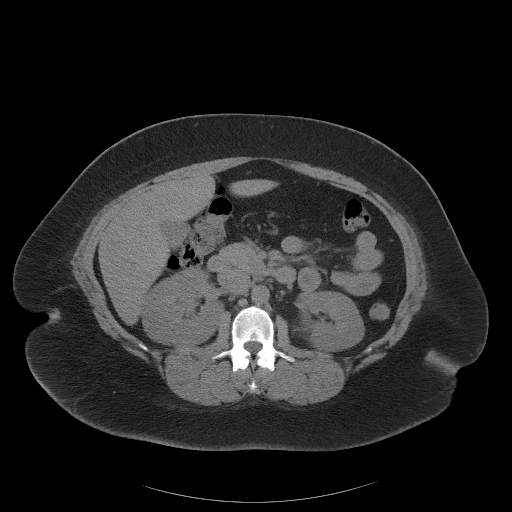
[im 71/94  soft-tissue]
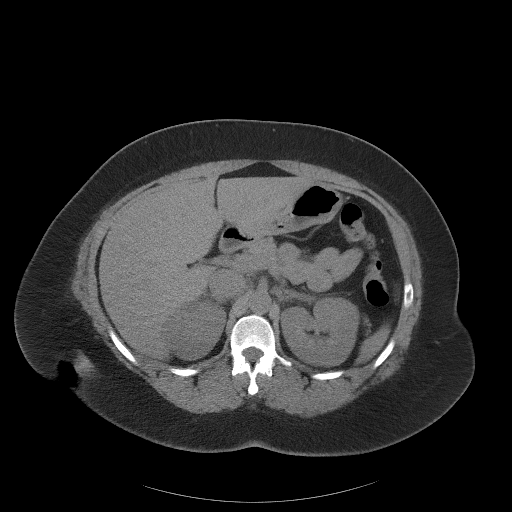
[im 75/94  soft-tissue]
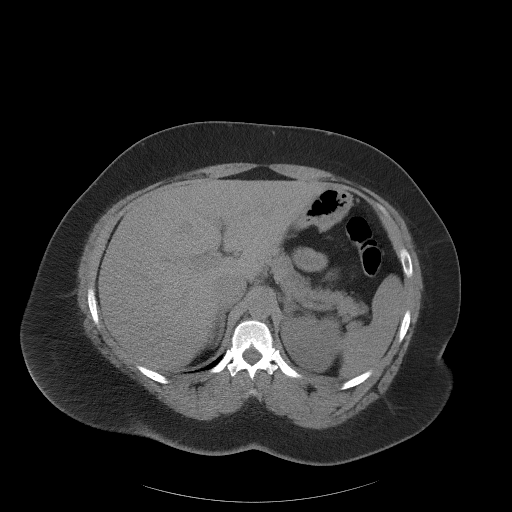
[im 82/94  soft-tissue]
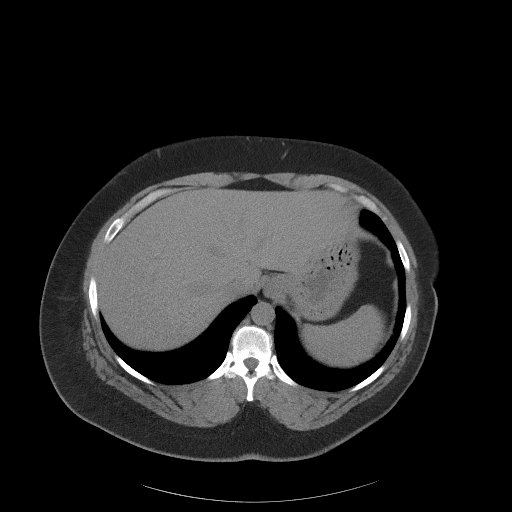
[im 90/94  soft-tissue]
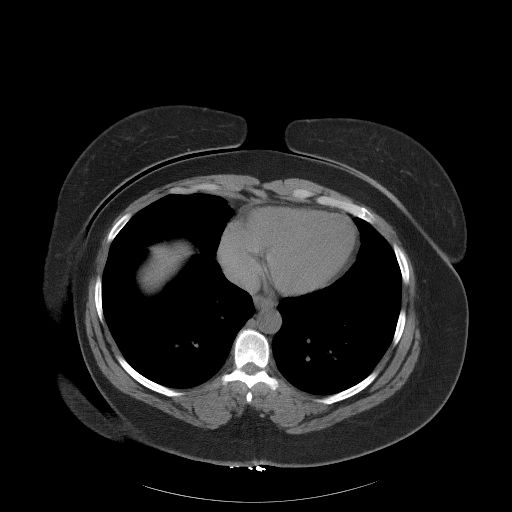

[Series 5: coronal st · coronal · 1.00mm/px · 3 of 117 slices shown]
[im 39/117  soft-tissue]
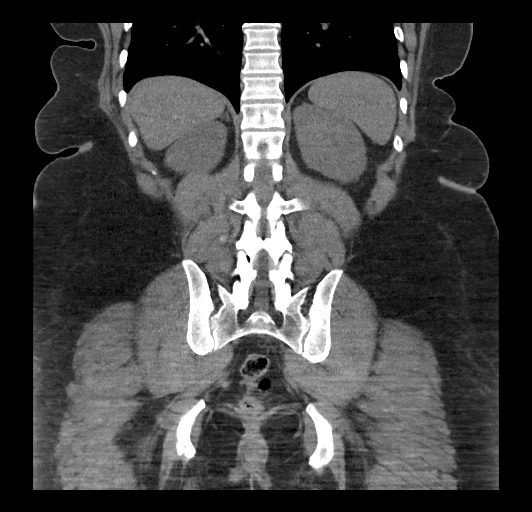
[im 52/117  soft-tissue]
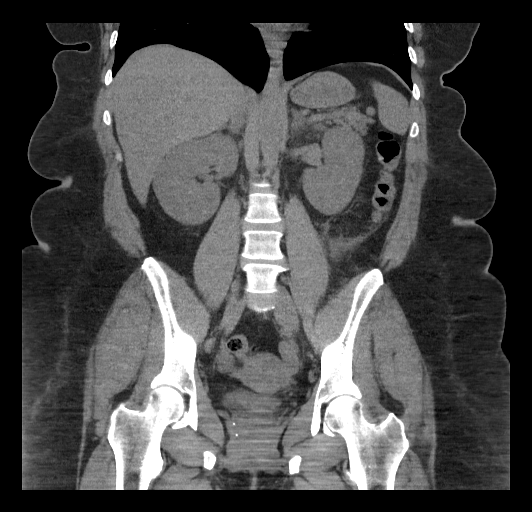
[im 65/117  soft-tissue]
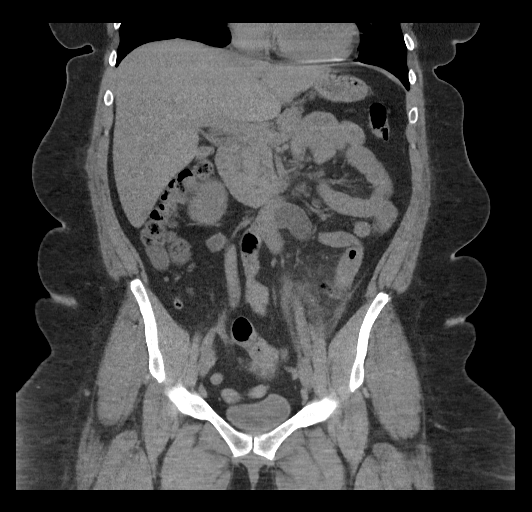

[17 of 46 positions shown; findings below may reference images not displayed]

FINDINGS: Lower chest: No acute abnormality.

Hepatobiliary: No focal liver abnormality is seen. No gallstones,
gallbladder wall thickening, or biliary dilatation.

Pancreas: Unremarkable. No pancreatic ductal dilatation or
surrounding inflammatory changes.

Spleen: Normal in size without focal abnormality.

Adrenals/Urinary Tract: Adrenal glands are unremarkable. Kidneys are
normal, without renal calculi, focal lesion, or hydronephrosis.
Bladder is unremarkable.

Stomach/Bowel: The stomach and appendix appear normal. There is no
evidence of bowel obstruction. Distal descending colonic
diverticulitis is noted with small amount of adjacent free air
concerning for small perforation. No definite abscess formation is
seen at this time.

Vascular/Lymphatic: No significant vascular findings are present. No
enlarged abdominal or pelvic lymph nodes.

Reproductive: Uterus and bilateral adnexa are unremarkable.

Other: No abdominal wall hernia or abnormality. No abdominopelvic
ascites.

Musculoskeletal: No acute or significant osseous findings.
IMPRESSION: Diverticulitis of distal descending colon is noted with adjacent
small amount of air concerning for small perforation. No definite
abscess is noted.
